# Patient Record
Sex: Female | Born: 1985 | Race: White | Hispanic: No | Marital: Single | Smoking: Never smoker
Health system: Southern US, Community
[De-identification: ages and names within clinical notes are randomized; demographics above are authoritative.]

## PROBLEM LIST (undated history)

## (undated) DIAGNOSIS — K469 Unspecified abdominal hernia without obstruction or gangrene: Secondary | ICD-10-CM

## (undated) DIAGNOSIS — J45909 Unspecified asthma, uncomplicated: Secondary | ICD-10-CM

## (undated) HISTORY — PX: CHOLECYSTECTOMY: SHX55

---

## 2021-04-27 ENCOUNTER — Encounter (HOSPITAL_BASED_OUTPATIENT_CLINIC_OR_DEPARTMENT_OTHER): Payer: Self-pay | Admitting: Emergency Medicine

## 2021-04-27 ENCOUNTER — Other Ambulatory Visit: Payer: Self-pay

## 2021-04-27 ENCOUNTER — Emergency Department (HOSPITAL_BASED_OUTPATIENT_CLINIC_OR_DEPARTMENT_OTHER): Payer: Self-pay

## 2021-04-27 ENCOUNTER — Emergency Department (HOSPITAL_BASED_OUTPATIENT_CLINIC_OR_DEPARTMENT_OTHER)
Admission: EM | Admit: 2021-04-27 | Discharge: 2021-04-27 | Disposition: A | Discharge status: Home / Self Care | Payer: Self-pay | Attending: Emergency Medicine | Admitting: Emergency Medicine

## 2021-04-27 DIAGNOSIS — M25571 Pain in right ankle and joints of right foot: Secondary | ICD-10-CM

## 2021-04-27 DIAGNOSIS — J45909 Unspecified asthma, uncomplicated: Secondary | ICD-10-CM | POA: Insufficient documentation

## 2021-04-27 HISTORY — DX: Unspecified asthma, uncomplicated: J45.909

## 2021-04-27 NOTE — ED Triage Notes (Signed)
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 Pt reports hx of fx x 4 years ago. Pt reports wt bearing increases pain, reports swelling

## 2021-04-27 NOTE — ED Provider Notes (Signed)
[facility] EMERGENCY DEPARTMENT Provider Note   CSN: [id number] Arrival date & time: 04/27/21  3335     History Chief Complaint  Patient presents with   Ankle Pain    Laura Rodriguez is a 35 y.o. female who presents with a cc of R ankle pain. Hx of ORIF 2.5 yrs ago. She is having pain in the ankle for the last 4 days. She has had increased walking and pain. Pain is usually 5/6 now 10/10. Pain is throbbing. Mostly in the lateral malleolar region. Radiates her knee in the distribution of the fibular nerve. She has been unable to bear weight. Patient denies numbness or tingling. She has no recent injuries. Has had ankle boot and PT in the past with improvement in sxs.   Ankle Pain Associated symptoms: no fever       Past Medical History:  Diagnosis Date   Asthma     There are no problems to display for this patient.   Past Surgical History:  Procedure Laterality Date   CESAREAN SECTION       OB History   No obstetric history on file.     History reviewed. No pertinent family history.  Social History   Substance Use Topics   Alcohol use: Never   Drug use: Never    Home Medications Prior to Admission medications   Not on File    Allergies    Amoxicillin, Doxycycline, and Zithromax [azithromycin]  Review of Systems   Review of Systems  Constitutional:  Negative for chills and fever.  Musculoskeletal:  Positive for gait problem and joint swelling.  Neurological:  Negative for weakness and numbness.   Physical Exam Updated Vital Signs BP 114/75   Pulse 84   Temp 98.3 F (36.8 C) (Oral)   Resp 18   Ht 5\' 7"  (1.702 m)   Wt (!) 149.7 kg   SpO2 96%   BMI 51.69 kg/m   Physical Exam Vitals and nursing note reviewed.  Constitutional:      General: She is not in acute distress.    Appearance: She is well-developed. She is not diaphoretic.  HENT:     Head: Normocephalic and atraumatic.     Right Ear: External ear normal.     Left  Ear: External ear normal.     Nose: Nose normal.     Mouth/Throat:     Mouth: Mucous membranes are moist.  Eyes:     General: No scleral icterus.    Conjunctiva/sclera: Conjunctivae normal.  Cardiovascular:     Rate and Rhythm: Normal rate and regular rhythm.     Heart sounds: Normal heart sounds. No murmur heard.   No friction rub. No gallop.  Pulmonary:     Effort: Pulmonary effort is normal. No respiratory distress.     Breath sounds: Normal breath sounds.  Abdominal:     General: Bowel sounds are normal. There is no distension.     Palpations: Abdomen is soft. There is no mass.     Tenderness: There is no abdominal tenderness. There is no guarding.  Musculoskeletal:     Cervical back: Normal range of motion.     Comments: R ankle with ttp along the post aspect of the lateral malleolus. Painful and reduced range of motion\ Minimal swelling noted.  Pain with dorsiflexion, inversion and eversion of the ankle are difficult since reconstruction of the ankle.  Neurovascularly intact.  Skin:    General: Skin is warm and dry.  Neurological:     Mental Status: She is alert and oriented to person, place, and time.  Psychiatric:        Behavior: Behavior normal.    ED Results / Procedures / Treatments   Labs (all labs ordered are listed, but only abnormal results are displayed) Labs Reviewed - No data to display  EKG None  Radiology DG Ankle Complete Right  Result Date: 04/27/2021 CLINICAL DATA:  35 year old female with right lateral ankle pain for 4 days with swelling. EXAM: RIGHT ANKLE - COMPLETE 3+ VIEW COMPARISON:  None. FINDINGS: Bone mineralization is within normal limits. Mortise joint alignment preserved. Talar dome intact. No evidence of joint effusion. Distal tibia and fibula appear intact. Calcaneus appears intact with prominent plantar degenerative spurring. Other visible bones of the right foot appear intact. Evidence of anterior soft tissue swelling. IMPRESSION: No  acute osseous abnormality identified about the right ankle. Electronically Signed   By: Odessa Fleming M.D.   On: 04/27/2021 11:01    Procedures Procedures   Medications Ordered in ED Medications - No data to display  ED Course  I have reviewed the triage vital signs and the nursing notes.  Pertinent labs & imaging results that were available during my care of the patient were reviewed by me and considered in my medical decision making (see chart for details).    MDM Rules/Calculators/A&P                            35 year old female with recurrent right ankle pain.  Appears to have either a tendinitis or a bursitis of the right ankle.  She does well with ankle boot and crutches.  Advised to take naproxen and ice her foot and ankle up to 5 times daily.  Patient given follow-up with podiatry.  Discussed return precautions.  She appears otherwise appropriate for discharge at this time. Final Clinical Impression(s) / ED Diagnoses Final diagnoses:  None    Rx / DC Orders ED Discharge Orders     None        Arthor Captain, PA-C 04/27/21 1152    Terald Sleeper, MD 04/27/21 [phone]

## 2021-04-27 NOTE — Discharge Instructions (Addendum)
Apply ice 20 min at at time at least 5 times a day. Take naproxen or ibuprofen at  daily with food. Contact a health care provider if: Your pain gets worse. Your pain is not relieved with medicines. You have a fever or chills. You are having more trouble with walking. You have new symptoms. Get help right away if: Your foot, leg, toes, or ankle: Tingles or becomes numb. Becomes swollen. Turns pale or blue.

## 2021-06-18 ENCOUNTER — Emergency Department (HOSPITAL_BASED_OUTPATIENT_CLINIC_OR_DEPARTMENT_OTHER): Payer: Self-pay

## 2021-06-18 ENCOUNTER — Other Ambulatory Visit: Payer: Self-pay

## 2021-06-18 ENCOUNTER — Encounter (HOSPITAL_BASED_OUTPATIENT_CLINIC_OR_DEPARTMENT_OTHER): Payer: Self-pay | Admitting: *Deleted

## 2021-06-18 ENCOUNTER — Emergency Department (HOSPITAL_BASED_OUTPATIENT_CLINIC_OR_DEPARTMENT_OTHER)
Admission: EM | Admit: 2021-06-18 | Discharge: 2021-06-18 | Disposition: A | Discharge status: Home / Self Care | Payer: Self-pay | Attending: Emergency Medicine | Admitting: Emergency Medicine

## 2021-06-18 DIAGNOSIS — J4541 Moderate persistent asthma with (acute) exacerbation: Secondary | ICD-10-CM | POA: Insufficient documentation

## 2021-06-18 DIAGNOSIS — Z20822 Contact with and (suspected) exposure to covid-19: Secondary | ICD-10-CM | POA: Insufficient documentation

## 2021-06-18 DIAGNOSIS — R000 Tachycardia, unspecified: Secondary | ICD-10-CM | POA: Insufficient documentation

## 2021-06-18 LAB — CBC WITH DIFFERENTIAL/PLATELET
Abs Immature Granulocytes: 0.15 10*3/uL — ABNORMAL HIGH (ref 0.00–0.07)
Basophils Absolute: 0 10*3/uL (ref 0.0–0.1)
Basophils Relative: 0 %
Eosinophils Absolute: 0.1 10*3/uL (ref 0.0–0.5)
Eosinophils Relative: 1 %
HCT: 42.4 % (ref 36.0–46.0)
Hemoglobin: 14.2 g/dL (ref 12.0–15.0)
Immature Granulocytes: 1 %
Lymphocytes Relative: 23 %
Lymphs Abs: 2.4 10*3/uL (ref 0.7–4.0)
MCH: 29.7 pg (ref 26.0–34.0)
MCHC: 33.5 g/dL (ref 30.0–36.0)
MCV: 88.7 fL (ref 80.0–100.0)
Monocytes Absolute: 0.7 10*3/uL (ref 0.1–1.0)
Monocytes Relative: 7 %
Neutro Abs: 7.1 10*3/uL (ref 1.7–7.7)
Neutrophils Relative %: 68 %
Platelets: 301 10*3/uL (ref 150–400)
RBC: 4.78 MIL/uL (ref 3.87–5.11)
RDW: 12.6 % (ref 11.5–15.5)
WBC: 10.5 10*3/uL (ref 4.0–10.5)
nRBC: 0 % (ref 0.0–0.2)

## 2021-06-18 LAB — BRAIN NATRIURETIC PEPTIDE: B Natriuretic Peptide: 17.8 pg/mL (ref 0.0–100.0)

## 2021-06-18 LAB — BASIC METABOLIC PANEL
Anion gap: 8 (ref 5–15)
BUN: 12 mg/dL (ref 6–20)
CO2: 22 mmol/L (ref 22–32)
Calcium: 9.1 mg/dL (ref 8.9–10.3)
Chloride: 106 mmol/L (ref 98–111)
Creatinine, Ser: 0.88 mg/dL (ref 0.44–1.00)
GFR, Estimated: >60 mL/min (ref >60)
Glucose, Bld: 155 mg/dL — ABNORMAL HIGH (ref 70–99)
Potassium: 3.8 mmol/L (ref 3.5–5.1)
Sodium: 136 mmol/L (ref 135–145)

## 2021-06-18 LAB — TROPONIN I (HIGH SENSITIVITY): Troponin I (High Sensitivity): <2 ng/L (ref <18)

## 2021-06-18 LAB — D-DIMER, QUANTITATIVE: D-Dimer, Quant: 0.5 ug/mL-FEU (ref 0.00–0.50)

## 2021-06-18 LAB — RESP PANEL BY RT-PCR (FLU A&B, COVID) ARPGX2
Influenza A by PCR: NEGATIVE
Influenza B by PCR: NEGATIVE
SARS Coronavirus 2 by RT PCR: NEGATIVE

## 2021-06-18 LAB — PREGNANCY, URINE: Preg Test, Ur: NEGATIVE

## 2021-06-18 MED ORDER — IOHEXOL 350 MG/ML SOLN
100.0000 mL | Freq: Once | INTRAVENOUS | Status: AC | PRN
  Administered 2021-06-18: 100 mL via INTRAVENOUS

## 2021-06-18 MED ORDER — IPRATROPIUM-ALBUTEROL 0.5-2.5 (3) MG/3ML IN SOLN
3.0000 mL | Freq: Once | RESPIRATORY_TRACT | Status: AC
  Administered 2021-06-18: 3 mL via RESPIRATORY_TRACT
  Filled 2021-06-18: qty 3

## 2021-06-18 MED ORDER — PREDNISONE 50 MG PO TABS
60.0000 mg | ORAL_TABLET | Freq: Once | ORAL | Status: AC
  Administered 2021-06-18: 60 mg via ORAL
  Filled 2021-06-18: qty 1

## 2021-06-18 MED ORDER — PREDNISONE 20 MG PO TABS: 40.0000 mg | ORAL_TABLET | Freq: Every day | ORAL | 0 refills | Status: AC

## 2021-06-18 MED ORDER — ALBUTEROL SULFATE HFA 108 (90 BASE) MCG/ACT IN AERS
2.0000 | INHALATION_SPRAY | Freq: Once | RESPIRATORY_TRACT | Status: AC
  Administered 2021-06-18: 2 via RESPIRATORY_TRACT
  Filled 2021-06-18: qty 6.7

## 2021-06-18 NOTE — ED Provider Notes (Signed)
[facility] EMERGENCY DEPARTMENT Provider Note   CSN: [id number] Arrival date & time: 06/18/21  0136     History Chief Complaint  Patient presents with   Shortness of Breath    Laura Rodriguez is a 35 y.o. female.  HPI 35 year old female presents with acute shortness of breath.  She states it started yesterday morning.  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  However now she is having more shortness of breath and some cough with intermittent blood.  Otherwise there is some clear or green sputum.  No fevers.  She feeling chest tightness that is in her front and back.  No fevers.  She has some chronic lower extremity swelling does not particularly worse today.  No abdominal pain.  She feels like the dyspnea is from asthma but her inhaler does not seem to be helping.  The shortness of breath got bad enough that she want to come in tonight.  Past Medical History:  Diagnosis Date   Asthma     There are no problems to display for this patient.   Past Surgical History:  Procedure Laterality Date   CESAREAN SECTION       OB History   No obstetric history on file.     History reviewed. No pertinent family history.  Social History   Substance Use Topics   Alcohol use: Never   Drug use: Never    Home Medications Prior to Admission medications   Medication Sig Start Date End Date Taking? Authorizing Provider  predniSONE (DELTASONE) 20 MG tablet Take 2 tablets (40 mg total) by mouth daily for 4 days. 06/18/21 06/22/21 Yes Pricilla Loveless, MD    Allergies    Amoxicillin, Doxycycline, and Zithromax [azithromycin]  Review of Systems   Review of Systems  Constitutional:  Negative for fever.  Respiratory:  Positive for cough and shortness of breath.   Cardiovascular:  Positive for chest pain.  Gastrointestinal:  Negative for abdominal  pain.  All other systems reviewed and are negative.  Physical Exam Updated Vital Signs BP 103/77   Pulse (!) 103   Temp 98 F (36.7 C) (Oral)   Resp (!) 27   Ht 5\' 7"  (1.702 m)   Wt 136.1 kg   SpO2 98%   BMI 46.99 kg/m   Physical Exam Vitals and nursing note reviewed.  Constitutional:      Appearance: She is well-developed. She is obese. She is not ill-appearing or diaphoretic.  HENT:     Head: Normocephalic and atraumatic.     Right Ear: External ear normal.     Left Ear: External ear normal.     Nose: Nose normal.  Eyes:     General:        Right eye: No discharge.        Left eye: No discharge.  Cardiovascular:     Rate and Rhythm: Regular rhythm. Tachycardia present.     Heart sounds: Normal heart sounds.     Comments: HR~100 Pulmonary:     Effort: Pulmonary effort is normal.     Breath sounds: Normal breath sounds.     Comments: There is some inspiratory wheezing when taking deep breaths Abdominal:     Palpations: Abdomen is soft.     Tenderness: There is no abdominal tenderness.  Musculoskeletal:     Right lower leg: No  edema.     Left lower leg: No edema.  Skin:    General: Skin is warm and dry.  Neurological:     Mental Status: She is alert.  Psychiatric:        Mood and Affect: Mood is not anxious.    ED Results / Procedures / Treatments   Labs (all labs ordered are listed, but only abnormal results are displayed) Labs Reviewed  BASIC METABOLIC PANEL - Abnormal; Notable for the following components:      Result Value   Glucose, Bld 155 (*)    All other components within normal limits  CBC WITH DIFFERENTIAL/PLATELET - Abnormal; Notable for the following components:   Abs Immature Granulocytes 0.15 (*)    All other components within normal limits  RESP PANEL BY RT-PCR (FLU A&B, COVID) ARPGX2  PREGNANCY, URINE  D-DIMER, QUANTITATIVE  BRAIN NATRIURETIC PEPTIDE  TROPONIN I (HIGH SENSITIVITY)    EKG EKG Interpretation  Date/Time:  Sunday  June 18 2021 02:14:54 EDT Ventricular Rate:  97 PR Interval:  143 QRS Duration: 100 QT Interval:  351 QTC Calculation: 446 R Axis:   60 Text Interpretation: Sinus rhythm Borderline T abnormalities, diffuse leads No old tracing to compare Confirmed by Pricilla Loveless [phone]) on 06/18/2021 2:33:23 AM  Radiology CT Angio Chest PE W and/or Wo Contrast  Result Date: 06/18/2021 CLINICAL DATA:  Worsening shortness of breath EXAM: CT ANGIOGRAPHY CHEST WITH CONTRAST TECHNIQUE: Multidetector CT imaging of the chest was performed using the standard protocol during bolus administration of intravenous contrast. Multiplanar CT image reconstructions and MIPs were obtained to evaluate the vascular anatomy. CONTRAST:  OMNIPAQUE IOHEXOL 350 MG/ML SOLN COMPARISON:  None. FINDINGS: Cardiovascular: Suboptimal opacification of the pulmonary arteries to the segmental level. Both the patient's body habitus and bolus dispersion limit vessel opacification. No evidence of pulmonary embolism. Normal heart size. No pericardial effusion. Mediastinum/Nodes: Negative for mass or adenopathy Lungs/Pleura: There is no edema, consolidation, effusion, or pneumothorax. Two nodules along the minor fissure are flat on coronal reformats and consistent with lymph nodes. Upper Abdomen: Negative Musculoskeletal: No acute or aggressive finding Review of the MIP images confirms the above findings. Other: Notable motion artifact at the lung bases. IMPRESSION: Suboptimal opacification of the pulmonary arteries. No evidence of pulmonary embolism or other acute process. Electronically Signed   By: Tiburcio Pea M.D.   On: 06/18/2021 04:09   DG Chest Portable 1 View  Result Date: 06/18/2021 CLINICAL DATA:  Cough and dyspnea EXAM: PORTABLE CHEST 1 VIEW COMPARISON:  None. FINDINGS: The heart size and mediastinal contours are within normal limits. Both lungs are clear. The visualized skeletal structures are unremarkable. IMPRESSION: No active  disease. Electronically Signed   By: Alcide Clever M.D.   On: 06/18/2021 02:28    Procedures Procedures   Medications Ordered in ED Medications  ipratropium-albuterol (DUONEB) 0.5-2.5 (3) MG/3ML nebulizer solution 3 mL (3 mLs Nebulization Given 06/18/21 0219)  iohexol (OMNIPAQUE) 350 MG/ML injection 100 mL (100 mLs Intravenous Contrast Given 06/18/21 0342)  predniSONE (DELTASONE) tablet 60 mg (60 mg Oral Given 06/18/21 0429)  albuterol (VENTOLIN HFA) 108 (90 Base) MCG/ACT inhaler 2 puff (2 puffs Inhalation Given 06/18/21 0442)    ED Course  I have reviewed the triage vital signs and the nursing notes.  Pertinent labs & imaging results that were available during my care of the patient were reviewed by me and considered in my medical decision making (see chart for details).    MDM Rules/Calculators/A&P  Most likely, patient is suffering from a viral illness and/or asthma exacerbation.  She is feeling somewhat better after some albuterol.  Still complains of some chest pain but with 24 hours of symptoms and negative troponin and benign ECG I think ACS is unlikely.  D-dimer is just at the cutoff so CTA was obtained.  It does not show an obvious PE though it is limited based on size and bolus timing.  However given lower concern for PE I think it is reasonable to treat as asthma and give return precautions.  She feels well enough for discharge at this time. No evidence of pneumonia. Final Clinical Impression(s) / ED Diagnoses Final diagnoses:  Moderate persistent asthma with exacerbation    Rx / DC Orders ED Discharge Orders          Ordered    predniSONE (DELTASONE) 20 MG tablet  Daily        06/18/21 0422             Pricilla Loveless, MD 06/18/21 [phone]

## 2021-06-18 NOTE — ED Notes (Signed)
Pt given ice pack for her back

## 2021-06-18 NOTE — ED Notes (Signed)
Pt walks to restroom with steady gait and assistance

## 2021-06-18 NOTE — ED Triage Notes (Signed)
Pt c/o sob that started this am and has gotten progressively worse; pt states tonight while at work she started vomiting bright red blood; pt states she used her inhaler with no relief

## 2021-06-18 NOTE — Discharge Instructions (Signed)
Use your albuterol inhaler 2 puffs every 4 hours as needed for cough or shortness of breath.  If you develop new or worsening chest pain, shortness of breath, coughing up blood, fever, or any other new/concerning symptoms then return to the ER for evaluation.

## 2021-06-18 NOTE — ED Notes (Signed)
Pt sitting fowlers in bed, upper airway noises heard. A/ox4, pt states she has asthma and has had an exacerbation since Saturday morning with no relief from inhaler. Speaking in full sentences. LS shallow and clear, airway noises heard from throat. RA SPO2 99%. Pt also c/o chest tightness from central chest to back which is different from her asthma

## 2021-08-05 ENCOUNTER — Encounter (HOSPITAL_BASED_OUTPATIENT_CLINIC_OR_DEPARTMENT_OTHER): Payer: Self-pay | Admitting: Urology

## 2021-08-05 ENCOUNTER — Other Ambulatory Visit: Payer: Self-pay

## 2021-08-05 ENCOUNTER — Emergency Department (HOSPITAL_BASED_OUTPATIENT_CLINIC_OR_DEPARTMENT_OTHER)
Admission: EM | Admit: 2021-08-05 | Discharge: 2021-08-06 | Disposition: A | Discharge status: Home / Self Care | Payer: Medicaid Other | Attending: Emergency Medicine | Admitting: Emergency Medicine

## 2021-08-05 DIAGNOSIS — Z20822 Contact with and (suspected) exposure to covid-19: Secondary | ICD-10-CM | POA: Insufficient documentation

## 2021-08-05 DIAGNOSIS — J45909 Unspecified asthma, uncomplicated: Secondary | ICD-10-CM | POA: Insufficient documentation

## 2021-08-05 DIAGNOSIS — R110 Nausea: Secondary | ICD-10-CM | POA: Insufficient documentation

## 2021-08-05 DIAGNOSIS — R197 Diarrhea, unspecified: Secondary | ICD-10-CM | POA: Insufficient documentation

## 2021-08-05 DIAGNOSIS — Z91040 Latex allergy status: Secondary | ICD-10-CM | POA: Insufficient documentation

## 2021-08-05 DIAGNOSIS — R1084 Generalized abdominal pain: Secondary | ICD-10-CM

## 2021-08-05 HISTORY — DX: Unspecified abdominal hernia without obstruction or gangrene: K46.9

## 2021-08-05 LAB — COMPREHENSIVE METABOLIC PANEL
ALT: 56 U/L — ABNORMAL HIGH (ref 0–44)
AST: 39 U/L (ref 15–41)
Albumin: 4 g/dL (ref 3.5–5.0)
Alkaline Phosphatase: 112 U/L (ref 38–126)
Anion gap: 8 (ref 5–15)
BUN: 12 mg/dL (ref 6–20)
CO2: 23 mmol/L (ref 22–32)
Calcium: 9.2 mg/dL (ref 8.9–10.3)
Chloride: 107 mmol/L (ref 98–111)
Creatinine, Ser: 0.69 mg/dL (ref 0.44–1.00)
GFR, Estimated: >60 mL/min (ref >60)
Glucose, Bld: 163 mg/dL — ABNORMAL HIGH (ref 70–99)
Potassium: 3.7 mmol/L (ref 3.5–5.1)
Sodium: 138 mmol/L (ref 135–145)
Total Bilirubin: 0.5 mg/dL (ref 0.3–1.2)
Total Protein: 7 g/dL (ref 6.5–8.1)

## 2021-08-05 LAB — CBC WITH DIFFERENTIAL/PLATELET
Abs Immature Granulocytes: 0.09 10*3/uL — ABNORMAL HIGH (ref 0.00–0.07)
Basophils Absolute: 0 10*3/uL (ref 0.0–0.1)
Basophils Relative: 0 %
Eosinophils Absolute: 0.1 10*3/uL (ref 0.0–0.5)
Eosinophils Relative: 1 %
HCT: 40.1 % (ref 36.0–46.0)
Hemoglobin: 13.2 g/dL (ref 12.0–15.0)
Immature Granulocytes: 1 %
Lymphocytes Relative: 23 %
Lymphs Abs: 2.2 10*3/uL (ref 0.7–4.0)
MCH: 29.7 pg (ref 26.0–34.0)
MCHC: 32.9 g/dL (ref 30.0–36.0)
MCV: 90.1 fL (ref 80.0–100.0)
Monocytes Absolute: 0.7 10*3/uL (ref 0.1–1.0)
Monocytes Relative: 7 %
Neutro Abs: 6.4 10*3/uL (ref 1.7–7.7)
Neutrophils Relative %: 68 %
Platelets: 291 10*3/uL (ref 150–400)
RBC: 4.45 MIL/uL (ref 3.87–5.11)
RDW: 12.6 % (ref 11.5–15.5)
WBC: 9.6 10*3/uL (ref 4.0–10.5)
nRBC: 0 % (ref 0.0–0.2)

## 2021-08-05 LAB — LIPASE, BLOOD: Lipase: 35 U/L (ref 11–51)

## 2021-08-05 MED ORDER — DICYCLOMINE HCL 10 MG PO CAPS
10.0000 mg | ORAL_CAPSULE | Freq: Once | ORAL | Status: AC
  Administered 2021-08-05: 10 mg via ORAL
  Filled 2021-08-05: qty 1

## 2021-08-05 MED ORDER — ONDANSETRON 4 MG PO TBDP
4.0000 mg | ORAL_TABLET | Freq: Once | ORAL | Status: AC
  Administered 2021-08-05: 4 mg via ORAL
  Filled 2021-08-05: qty 1

## 2021-08-05 NOTE — ED Provider Notes (Signed)
Emergency Department Provider Note   I have reviewed the triage vital signs and the nursing notes.   HISTORY  Chief Complaint Abdominal Pain   HPI Laura Rodriguez is a 35 y.o. female with PMH of asthma and abdominal hernia presents to the ED with nausea along with vomiting.  Patient has had some associated abdominal discomfort as well as burning pain in the abdomen.  She states this is typical of her "bacterial" infections and typically resolves with bentyl.  She has recently moved here from [place] and does not have a PCP or GI physician.  She describes taking Bentyl in the past but ran out 2 months ago.  Her current symptoms began in the past week.  She is able to keep food and fluids down but has diarrhea shortly afterwards.  She denies any pain into the chest.  Abdominal pain is epigastric and burning in quality.  She has seen some trace bright red blood in the stool but no rectal pain or black stool.   Past Medical History:  Diagnosis Date   Asthma    Hernia of abdominal cavity     There are no problems to display for this patient.   Past Surgical History:  Procedure Laterality Date   CESAREAN SECTION     CHOLECYSTECTOMY      Allergies Amoxicillin, Doxycycline, Latex, and Zithromax [azithromycin]  History reviewed. No pertinent family history.  Social History Social History   Substance Use Topics   Alcohol use: Never   Drug use: Never    Review of Systems  Constitutional: No fever/chills Eyes: No visual changes. ENT: No sore throat. Cardiovascular: Denies chest pain. Respiratory: Denies shortness of breath. Gastrointestinal: Positive abdominal pain.  Positive nausea, no vomiting. Positive diarrhea.  No constipation. Genitourinary: Negative for dysuria. Musculoskeletal: Negative for back pain. Skin: Negative for rash. Neurological: Negative for headaches, focal weakness or numbness.  10-point ROS otherwise  negative.  ____________________________________________   PHYSICAL EXAM:  VITAL SIGNS: ED Triage Vitals  Enc Vitals Group     BP 08/05/21 2247 (!) 151/109     Pulse Rate 08/05/21 2247 (!) 110     Resp 08/05/21 2247 18     Temp 08/05/21 2247 99.2 F (37.3 C)     Temp Source 08/05/21 2247 Oral     SpO2 08/05/21 2247 98 %     Weight 08/05/21 2245 (!) 300 lb 0.7 oz (136.1 kg)     Height 08/05/21 2245 5\' 7"  (1.702 m)   Constitutional: Alert and oriented. Well appearing and in no acute distress. Eyes: Conjunctivae are normal. Head: Atraumatic. Nose: No congestion/rhinnorhea. Mouth/Throat: Mucous membranes are moist.   Neck: No stridor.  Cardiovascular: Normal rate, regular rhythm. Good peripheral circulation. Grossly normal heart sounds.   Respiratory: Normal respiratory effort.  No retractions. Lungs CTAB. Gastrointestinal: Soft with palpable large hernia just superior to the umbilicus. No peritonitis. No overlying skin changes. No distention.  Musculoskeletal: No lower extremity tenderness nor edema. No gross deformities of extremities. Neurologic:  Normal speech and language. No gross focal neurologic deficits are appreciated.  Skin:  Skin is warm, dry and intact. No rash noted.  ____________________________________________   LABS (all labs ordered are listed, but only abnormal results are displayed)  Labs Reviewed  CBC WITH DIFFERENTIAL/PLATELET - Abnormal; Notable for the following components:      Result Value   Abs Immature Granulocytes 0.09 (*)    All other components within normal limits  COMPREHENSIVE METABOLIC PANEL - Abnormal;  Notable for the following components:   Glucose, Bld 163 (*)    ALT 56 (*)    All other components within normal limits  RESP PANEL BY RT-PCR (FLU A&B, COVID) ARPGX2  LIPASE, BLOOD  URINALYSIS, ROUTINE W REFLEX MICROSCOPIC  PREGNANCY, URINE   ____________________________________________  RADIOLOGY  None    ____________________________________________   PROCEDURES  Procedure(s) performed:   Procedures  None  ____________________________________________   INITIAL IMPRESSION / ASSESSMENT AND PLAN / ED COURSE  Pertinent labs & imaging results that were available during my care of the patient were reviewed by me and considered in my medical decision making (see chart for details).   Patient presents emergency department with abdominal pain along with nausea and diarrhea.  Abdomen is diffusely soft and nontender.  She does have a large palpable hernia which is baseline for her.  This does not appear incarcerated or strangulated.  Patient is very well-appearing.  She is up and walking about the room without difficulty.  She describes similar episodes that respond to Bentyl and Zofran.  Her vital signs on arrival show heart rate of 110 but on my evaluation in the room her vital signs are all within normal limits.  She is afebrile here.  Given her exam and history of similar episodes I do plan to hold on CT imaging for now and follow lab work along with bentyl and zofran given here.   Labs are reassuring. No AKI. No leukocytosis. Normal LFTs and bilirubin. Patient is feeling better after bentyl and zofran with normalized vital signs. UA and pregnancy pending.   UA and U preg WNL. Plan for bentyl, zofran, lomotil and conact information for PCP. Patient is tolerating PO without difficulty and ambulatory in the department. Appears well-hydrated here.  ____________________________________________  FINAL CLINICAL IMPRESSION(S) / ED DIAGNOSES  Final diagnoses:  Generalized abdominal pain  Nausea  Diarrhea, unspecified type     MEDICATIONS GIVEN DURING THIS VISIT:  Medications  dicyclomine (BENTYL) capsule 10 mg (10 mg Oral Given 08/05/21 2313)  ondansetron (ZOFRAN-ODT) disintegrating tablet 4 mg (4 mg Oral Given 08/05/21 2313)     NEW OUTPATIENT MEDICATIONS STARTED DURING THIS  VISIT:  New Prescriptions   DICYCLOMINE (BENTYL) 20 MG TABLET    Take 1 tablet (20 mg total) by mouth 3 (three) times daily as needed for spasms (abdominal cramping).   DIPHENOXYLATE-ATROPINE (LOMOTIL) 2.5-0.025 MG TABLET    Take 1 tablet by mouth 4 (four) times daily as needed for diarrhea or loose stools.   ONDANSETRON (ZOFRAN ODT) 4 MG DISINTEGRATING TABLET    Take 1 tablet (4 mg total) by mouth every 8 (eight) hours as needed.    Note:  This document was prepared using Dragon voice recognition software and may include unintentional dictation errors.  Alona Bene, MD, [facility]    Annabelle Rexroad, Arlyss Repress, MD 08/06/21 [phone]

## 2021-08-05 NOTE — ED Triage Notes (Signed)
Nausea/vomiting, diarrhea, abdominal pain "burning pain".  States bright red blood in stool.  H/o hernia.  States "pressure cramps".  H/o same symptoms takes bentyl for symptoms but is out of medication

## 2021-08-05 NOTE — ED Notes (Signed)
Pt was given ginger ale with ice.

## 2021-08-06 LAB — RESP PANEL BY RT-PCR (FLU A&B, COVID) ARPGX2
Influenza A by PCR: NEGATIVE
Influenza B by PCR: NEGATIVE
SARS Coronavirus 2 by RT PCR: NEGATIVE

## 2021-08-06 LAB — URINALYSIS, ROUTINE W REFLEX MICROSCOPIC
Bilirubin Urine: NEGATIVE
Glucose, UA: NEGATIVE mg/dL
Hgb urine dipstick: NEGATIVE
Ketones, ur: NEGATIVE mg/dL
Leukocytes,Ua: NEGATIVE
Nitrite: NEGATIVE
Protein, ur: NEGATIVE mg/dL
Specific Gravity, Urine: >=1.03 (ref 1.005–1.030)
pH: 5.5 (ref 5.0–8.0)

## 2021-08-06 LAB — PREGNANCY, URINE: Preg Test, Ur: NEGATIVE

## 2021-08-06 MED ORDER — DIPHENOXYLATE-ATROPINE 2.5-0.025 MG PO TABS: 1.0000 | ORAL_TABLET | Freq: Four times a day (QID) | ORAL | 0 refills | Status: AC | PRN

## 2021-08-06 MED ORDER — DICYCLOMINE HCL 20 MG PO TABS: 20.0000 mg | ORAL_TABLET | Freq: Three times a day (TID) | ORAL | 0 refills | Status: AC | PRN

## 2021-08-06 MED ORDER — ONDANSETRON 4 MG PO TBDP: 4.0000 mg | ORAL_TABLET | Freq: Three times a day (TID) | ORAL | 0 refills | Status: AC | PRN

## 2021-08-06 NOTE — ED Notes (Signed)
Oral hydration provided. Pt sts she will attempt to urinate after.

## 2021-08-06 NOTE — Discharge Instructions (Signed)

## 2021-11-28 ENCOUNTER — Emergency Department (HOSPITAL_BASED_OUTPATIENT_CLINIC_OR_DEPARTMENT_OTHER)
Admission: EM | Admit: 2021-11-28 | Discharge: 2021-11-28 | Disposition: A | Discharge status: Home / Self Care | Payer: Medicaid Other | Attending: Emergency Medicine | Admitting: Emergency Medicine

## 2021-11-28 ENCOUNTER — Emergency Department (HOSPITAL_BASED_OUTPATIENT_CLINIC_OR_DEPARTMENT_OTHER): Payer: Medicaid Other

## 2021-11-28 ENCOUNTER — Encounter (HOSPITAL_BASED_OUTPATIENT_CLINIC_OR_DEPARTMENT_OTHER): Payer: Self-pay | Admitting: Urology

## 2021-11-28 ENCOUNTER — Other Ambulatory Visit (HOSPITAL_BASED_OUTPATIENT_CLINIC_OR_DEPARTMENT_OTHER): Payer: Self-pay

## 2021-11-28 ENCOUNTER — Other Ambulatory Visit: Payer: Self-pay

## 2021-11-28 DIAGNOSIS — W208XXA Other cause of strike by thrown, projected or falling object, initial encounter: Secondary | ICD-10-CM | POA: Insufficient documentation

## 2021-11-28 DIAGNOSIS — S97112A Crushing injury of left great toe, initial encounter: Secondary | ICD-10-CM | POA: Insufficient documentation

## 2021-11-28 DIAGNOSIS — S97111A Crushing injury of right great toe, initial encounter: Secondary | ICD-10-CM | POA: Insufficient documentation

## 2021-11-28 DIAGNOSIS — S97119A Crushing injury of unspecified great toe, initial encounter: Secondary | ICD-10-CM

## 2021-11-28 MED ORDER — CEPHALEXIN 500 MG PO CAPS
500.0000 mg | ORAL_CAPSULE | Freq: Four times a day (QID) | ORAL | 0 refills | Status: AC
  Filled 2021-11-28: qty 28, 7d supply, fill #0

## 2021-11-28 MED ORDER — HYDROCODONE-ACETAMINOPHEN 5-325 MG PO TABS
1.0000 | ORAL_TABLET | Freq: Four times a day (QID) | ORAL | 0 refills | Status: DC | PRN
  Filled 2021-11-28: qty 14, 4d supply, fill #0

## 2021-11-28 NOTE — ED Provider Notes (Signed)
?[facility] EMERGENCY DEPARTMENT ?Provider Note ? ? ?CSN: [id number] ?Arrival date & time: 11/28/21  1458 ? ?  ? ?History ? ?Chief Complaint  ?Patient presents with  ? Toe Injury  ? ? ?Laura Rodriguez is a 36 y.o. female. ? ?Patient with a complaint of bilateral big toe pain.  And pain to the top of her foot bilateral.  States she dropped a couch on it.  States also there was an ingrown toenail on the left toenail.  And had some pus come out.  Patient has a listing of a lot of allergies to include doxycycline also amoxicillin.  But says she has had Keflex before cephalosporins without any problem.  Injury occurred on Friday ? ?Patient's past medical history is significant for asthma. ? ? ?  ? ?Home Medications ?Prior to Admission medications   ?Medication Sig Start Date End Date Taking? Authorizing Provider  ?cephALEXin (KEFLEX) 500 MG capsule Take 1 capsule (500 mg total) by mouth 4 (four) times daily. 11/28/21  Yes Fredia Sorrow, MD  ?dicyclomine (BENTYL) 20 MG tablet Take 1 tablet (20 mg total) by mouth 3 (three) times daily as needed for spasms (abdominal cramping). 08/06/21   Long, Wonda Olds, MD  ?diphenoxylate-atropine (LOMOTIL) 2.5-0.025 MG tablet Take 1 tablet by mouth 4 (four) times daily as needed for diarrhea or loose stools. 08/06/21   Long, Wonda Olds, MD  ?ondansetron (ZOFRAN ODT) 4 MG disintegrating tablet Take 1 tablet (4 mg total) by mouth every 8 (eight) hours as needed. 08/06/21   Long, Wonda Olds, MD  ?   ? ?Allergies    ?Amoxicillin, Doxycycline, Latex, and Zithromax [azithromycin]   ? ?Review of Systems   ?Review of Systems  ?Constitutional:  Negative for chills and fever.  ?HENT:  Negative for ear pain and sore throat.   ?Eyes:  Negative for pain and visual disturbance.  ?Respiratory:  Negative for cough and shortness of breath.   ?Cardiovascular:  Negative for chest pain and palpitations.  ?Gastrointestinal:  Negative for abdominal pain and vomiting.  ?Genitourinary:  Negative for  dysuria and hematuria.  ?Musculoskeletal:  Negative for arthralgias and back pain.  ?Skin:  Negative for color change and rash.  ?Neurological:  Negative for seizures and syncope.  ?All other systems reviewed and are negative. ? ?Physical Exam ?Updated Vital Signs ?BP 126/68 (BP Location: Right Arm)   Pulse 87   Temp 98.2 ?F (36.8 ?C) (Oral)   Resp 17   Ht 1.702 m (5\' 7" )   Wt (!) 136.1 kg   SpO2 100%   BMI 46.99 kg/m?  ?Physical Exam ?Vitals and nursing note reviewed.  ?Constitutional:   ?   General: She is not in acute distress. ?   Appearance: Normal appearance. She is well-developed.  ?HENT:  ?   Head: Normocephalic and atraumatic.  ?Eyes:  ?   Extraocular Movements: Extraocular movements intact.  ?   Conjunctiva/sclera: Conjunctivae normal.  ?Cardiovascular:  ?   Rate and Rhythm: Normal rate and regular rhythm.  ?   Heart sounds: No murmur heard. ?Pulmonary:  ?   Effort: Pulmonary effort is normal. No respiratory distress.  ?   Breath sounds: Normal breath sounds.  ?Abdominal:  ?   Palpations: Abdomen is soft.  ?   Tenderness: There is no abdominal tenderness.  ?Musculoskeletal:     ?   General: Swelling and tenderness present.  ?   Cervical back: Neck supple.  ?   Comments: Both big toes have a  little bit of bruising to the lateral aspect of the big toenails.  The left toenail has no significant erythema in the paronychia.  But there is some whiteness to the lateral aspect of the nail.  Patient to trim the nail back there.  There could have been evidence of an ingrown toenail there.  No pus draining currently.  Good cap refill to all the toes.  There is tenderness to palpation to the top of both toes and the top of the left and right foot.  But no signs of any bruising there.  Dorsalis pedis pulses 2+.  Good cap refill sensation intact.  ?Skin: ?   General: Skin is warm and dry.  ?   Capillary Refill: Capillary refill takes less than 2 seconds.  ?Neurological:  ?   General: No focal deficit present.  ?    Mental Status: She is alert and oriented to person, place, and time.  ?Psychiatric:     ?   Mood and Affect: Mood normal.  ? ? ?ED Results / Procedures / Treatments   ?Labs ?(all labs ordered are listed, but only abnormal results are displayed) ?Labs Reviewed - No data to display ? ?EKG ?None ? ?Radiology ?DG Toe Great Left ? ?Result Date: 11/28/2021 ?CLINICAL DATA:  Trauma yesterday.  Pain and swelling. EXAM: LEFT GREAT TOE COMPARISON:  None. FINDINGS: No acute fracture or dislocation. IMPRESSION: No acute osseous abnormality. Electronically Signed   By: Abigail Miyamoto M.D.   On: 11/28/2021 15:45  ? ?DG Toe Great Right ? ?Result Date: 11/28/2021 ?CLINICAL DATA:  Dropped couch on toes yesterday EXAM: RIGHT GREAT TOE COMPARISON:  None. FINDINGS: There is no evidence of fracture or dislocation. There is no evidence of arthropathy or other focal bone abnormality. Soft tissues are unremarkable. IMPRESSION: Negative. Electronically Signed   By: Jacqulynn Cadet M.D.   On: 11/28/2021 15:47   ? ?Procedures ?Procedures  ? ? ?Medications Ordered in ED ?Medications - No data to display ? ?ED Course/ Medical Decision Making/ A&P ?  ?                        ?Medical Decision Making ?Amount and/or Complexity of Data Reviewed ?Radiology: ordered. ? ?Risk ?Prescription drug management. ? ? ?Patient has evidence of some trauma to both great toes.  X-rays negative for any fractures.  X-rays are just of the big toes.  No significant swelling or bruising to the forefoot area.  Do not feel x-rays are necessary there.  There is some evidence of a small subungual hematoma.  This injury would have occurred on Friday.  Specifically no indication for drainage of the subungual area. ? ?Patient will be treated with Keflex.  She is able to take cephalosporins even though she has significant allergy to penicillin.  Patient states she has had that before without any problems.  Patient not able to take hydrocodone at all she gets significant hives  and not aware of being able to take any other pain medications.  So recommended nonsteroidal but she cannot take that because she has Crohn's.  So we will get is got a go with Tylenol and the Keflex and soaking her feet bilaterally and warm water for 20 minutes twice a day.  And then follow-up with podiatry.  Work note provided to be out of work through Friday. ? ? ?Final Clinical Impression(s) / ED Diagnoses ?Final diagnoses:  ?Crushing injury of great toe, initial encounter  ? ? ?  Rx / DC Orders ?ED Discharge Orders   ? ?      Ordered  ?  cephALEXin (KEFLEX) 500 MG capsule  4 times daily       ? 11/28/21 1701  ?  HYDROcodone-acetaminophen (NORCO/VICODIN) 5-325 MG tablet  Every 6 hours PRN,   Status:  Discontinued       ? 11/28/21 1705  ? ?  ?  ? ?  ? ? ?  ?Fredia Sorrow, MD ?11/28/21 1756 ? ?

## 2021-11-28 NOTE — Discharge Instructions (Addendum)
Take the amoxicillin as directed.  Make an appointment follow-up with podiatry.  Work note provided.  Soak your feet in warm water for 20 minutes twice a day.  X-rays of feet without any bony abnormalities. ? ?Due to the allergy to hydrocodone have canceled the prescription for that with the pharmacy here.  Just take Tylenol as needed. ?

## 2021-11-28 NOTE — ED Triage Notes (Signed)
Pt states dropped couch on bilateral big toes yesterday ?States painful to walk  ?Bruising noted  ?Mild swelling to left great toe  ?

## 2022-02-26 ENCOUNTER — Emergency Department (HOSPITAL_BASED_OUTPATIENT_CLINIC_OR_DEPARTMENT_OTHER)
Admission: EM | Admit: 2022-02-26 | Discharge: 2022-02-26 | Disposition: A | Discharge status: Home / Self Care | Payer: Medicaid Other | Attending: Emergency Medicine | Admitting: Emergency Medicine

## 2022-02-26 ENCOUNTER — Emergency Department (HOSPITAL_BASED_OUTPATIENT_CLINIC_OR_DEPARTMENT_OTHER): Payer: Medicaid Other

## 2022-02-26 ENCOUNTER — Other Ambulatory Visit: Payer: Self-pay

## 2022-02-26 DIAGNOSIS — M25511 Pain in right shoulder: Secondary | ICD-10-CM | POA: Insufficient documentation

## 2022-02-26 DIAGNOSIS — Z91040 Latex allergy status: Secondary | ICD-10-CM | POA: Insufficient documentation

## 2022-02-26 MED ORDER — KETOROLAC TROMETHAMINE 15 MG/ML IJ SOLN
15.0000 mg | Freq: Once | INTRAMUSCULAR | Status: AC
  Administered 2022-02-26: 15 mg via INTRAMUSCULAR
  Filled 2022-02-26: qty 1

## 2022-02-26 NOTE — Discharge Instructions (Signed)
Take 4 over the counter ibuprofen tablets 3 times a day or 2 over-the-counter naproxen tablets twice a day for pain. Also take tylenol 1000mg (2 extra strength) four times a day.    You are to take your arm out of the sling at least 4 times a day and perform range of motion exercises.  Please call the sports medicine doctor in the morning and see if they can see you in the office.

## 2022-02-26 NOTE — ED Triage Notes (Addendum)
Pt POV reports ongoing shoulder pain x2 months, today worsened at work. Reports poor grip strength in right hand, reports tingling in arm and fingers. Reports feeling something tear in bicep.

## 2022-02-26 NOTE — ED Provider Notes (Signed)
[facility] EMERGENCY DEPARTMENT Provider Note   CSN: [id number] Arrival date & time: 02/26/22  1623     History  Chief Complaint  Patient presents with   Shoulder Pain    Laura Rodriguez is a 36 y.o. female.  36 yo F with a chief complaint of right shoulder pain.  This been going on for weeks now.  She said that she moved a very heavy piece of furniture and after that felt like something had popped or torn.  She had recurrence of this today when she was trying to move something around.  She is convinced that she is torn her bicep and feels like she can feel something that is abnormal.  She feels it in her axilla.  She denies any obvious traumatic event.  She feels like the arm is more swollen on that side and feels like she is having trouble holding things with that arm.   Shoulder Pain      Home Medications Prior to Admission medications   Medication Sig Start Date End Date Taking? Authorizing Provider  cephALEXin (KEFLEX) 500 MG capsule Take 1 capsule (500 mg total) by mouth 4 (four) times daily. 11/28/21   Vanetta Mulders, MD  dicyclomine (BENTYL) 20 MG tablet Take 1 tablet (20 mg total) by mouth 3 (three) times daily as needed for spasms (abdominal cramping). 08/06/21   Long, Arlyss Repress, MD  diphenoxylate-atropine (LOMOTIL) 2.5-0.025 MG tablet Take 1 tablet by mouth 4 (four) times daily as needed for diarrhea or loose stools. 08/06/21   Long, Arlyss Repress, MD  ondansetron (ZOFRAN ODT) 4 MG disintegrating tablet Take 1 tablet (4 mg total) by mouth every 8 (eight) hours as needed. 08/06/21   Long, Arlyss Repress, MD      Allergies    Amoxicillin, Doxycycline, Latex, and Zithromax [azithromycin]    Review of Systems   Review of Systems  Physical Exam Updated Vital Signs BP (!) 124/97 (BP Location: Left Wrist)   Pulse (!) 107   Temp 99.4 F (37.4 C) (Oral)   Resp 19   Ht 5\' 7"  (1.702 m)   Wt (!) 149.7 kg   SpO2 98%   BMI 51.69 kg/m  Physical Exam Vitals and  nursing note reviewed.  Constitutional:      General: She is not in acute distress.    Appearance: She is well-developed. She is not diaphoretic.     Comments: BMI 51  HENT:     Head: Normocephalic and atraumatic.  Eyes:     Pupils: Pupils are equal, round, and reactive to light.  Cardiovascular:     Rate and Rhythm: Normal rate and regular rhythm.     Heart sounds: No murmur heard.    No friction rub. No gallop.  Pulmonary:     Effort: Pulmonary effort is normal.     Breath sounds: No wheezing or rales.  Abdominal:     General: There is no distension.     Palpations: Abdomen is soft.     Tenderness: There is no abdominal tenderness.  Musculoskeletal:        General: Tenderness present.     Cervical back: Normal range of motion and neck supple.     Comments: Pulse motor and sensation intact of the right upper extremity.  She has some mild pain about the clavicle without obvious crepitus or deformity.  Exam is somewhat limited due to body habitus.  Also limited to was described as severe discomfort.  Skin:  General: Skin is warm and dry.  Neurological:     Mental Status: She is alert and oriented to person, place, and time.  Psychiatric:        Behavior: Behavior normal.     ED Results / Procedures / Treatments   Labs (all labs ordered are listed, but only abnormal results are displayed) Labs Reviewed - No data to display  EKG None  Radiology DG Shoulder Right  Result Date: 02/26/2022 CLINICAL DATA:  Right shoulder pain EXAM: RIGHT SHOULDER - 2+ VIEW COMPARISON:  None Available. FINDINGS: There is no evidence of fracture or dislocation. There is no evidence of arthropathy or other focal bone abnormality. Soft tissues are unremarkable. IMPRESSION: Negative. Electronically Signed   By: Frazier Richards M.D.   On: 02/26/2022 17:37    Procedures Procedures    Medications Ordered in ED Medications  ketorolac (TORADOL) 15 MG/ML injection 15 mg (has no administration in  time range)    ED Course/ Medical Decision Making/ A&P                           Medical Decision Making Amount and/or Complexity of Data Reviewed Radiology: ordered.  Risk Prescription drug management.   36 yo F with a chief complaint of right shoulder pain.  This been going on for at least a month.  She to me that she moved something heavy and after which had some discomfort there.  She is pretty convinced that she tore something and is asking what it is that she tore specifically.  I discussed the work-up that would be typically performed here in the emergency department and recommended outpatient follow-up.  She again asked specifically what was torn and then later added that she has had problems with that shoulder in the past.  Has had her rotator cuff torn.  Will place in a sling for comfort.  Plain film of the shoulder independently interpreted by me without fx or dislocation.   5:44 PM:  I have discussed the diagnosis/risks/treatment options with the patient.  Evaluation and diagnostic testing in the emergency department does not suggest an emergent condition requiring admission or immediate intervention beyond what has been performed at this time.  They will follow up with  PCP. We also discussed returning to the ED immediately if new or worsening sx occur. We discussed the sx which are most concerning (e.g., sudden worsening pain, fever, inability to tolerate by mouth) that necessitate immediate return. Medications administered to the patient during their visit and any new prescriptions provided to the patient are listed below.  Medications given during this visit Medications  ketorolac (TORADOL) 15 MG/ML injection 15 mg (has no administration in time range)     The patient appears reasonably screen and/or stabilized for discharge and I doubt any other medical condition or other [facility] requiring further screening, evaluation, or treatment in the ED at this time prior to discharge.           Final Clinical Impression(s) / ED Diagnoses Final diagnoses:  Acute pain of right shoulder    Rx / DC Orders ED Discharge Orders     None         Deno Etienne, DO 02/26/22 1744

## 2022-02-28 ENCOUNTER — Encounter: Payer: Self-pay | Admitting: Family Medicine

## 2022-02-28 ENCOUNTER — Ambulatory Visit (HOSPITAL_BASED_OUTPATIENT_CLINIC_OR_DEPARTMENT_OTHER)
Admission: RE | Admit: 2022-02-28 | Discharge: 2022-02-28 | Disposition: A | Discharge status: Home / Self Care | Payer: Commercial Managed Care - HMO | Source: Ambulatory Visit | Attending: Family Medicine | Admitting: Family Medicine

## 2022-02-28 ENCOUNTER — Other Ambulatory Visit (HOSPITAL_BASED_OUTPATIENT_CLINIC_OR_DEPARTMENT_OTHER): Payer: Self-pay

## 2022-02-28 ENCOUNTER — Ambulatory Visit (INDEPENDENT_AMBULATORY_CARE_PROVIDER_SITE_OTHER): Payer: 59 | Admitting: Family Medicine

## 2022-02-28 VITALS — Ht 67.0 in | Wt 330.0 lb

## 2022-02-28 DIAGNOSIS — M5412 Radiculopathy, cervical region: Secondary | ICD-10-CM | POA: Diagnosis present

## 2022-02-28 DIAGNOSIS — M7551 Bursitis of right shoulder: Secondary | ICD-10-CM | POA: Diagnosis not present

## 2022-02-28 MED ORDER — PREDNISONE 5 MG PO TABS
ORAL_TABLET | ORAL | 0 refills | Status: AC
  Filled 2022-02-28: qty 21, 6d supply, fill #0

## 2022-02-28 MED ORDER — GABAPENTIN 300 MG PO CAPS
300.0000 mg | ORAL_CAPSULE | Freq: Three times a day (TID) | ORAL | 1 refills | Status: AC
  Filled 2022-02-28: qty 90, 30d supply, fill #0

## 2022-02-28 NOTE — Assessment & Plan Note (Signed)
Acutely occurring.  Symptoms appear consistent with a bursitis as she has localized pain in the area. -Counseled on home exercise therapy and supportive care. -Could consider injection or physical therapy.

## 2022-02-28 NOTE — Assessment & Plan Note (Signed)
Acute on chronic in nature.  Has a history of injury from years ago.  Most consistent severe pain that originates in the next and with weakness in the right hand, is most consistent with a nerve impingement. -Counseled on home exercise therapy and supportive care. -Prednisone. -Gabapentin. -Neck x-ray. -MRI of the cervical spine to evaluate for nerve impingement and consideration of epidural use.
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
## 2022-02-28 NOTE — Patient Instructions (Signed)
Nice to meet you Please try heat  Please try the exercises  I will call with the xray results.  We will get the MRi at [facility] imaging   Please send me a message in MyChart with any questions or updates.  We will setup a virtual visit once the MRI is resulted.   --Dr. Jordan Likes

## 2022-03-02 ENCOUNTER — Telehealth: Payer: Self-pay | Admitting: Family Medicine

## 2022-03-02 NOTE — Telephone Encounter (Signed)
Unable to leave VM for patient. If she calls back please have her speak with a nurse/CMA and inform that her xray shows signs of spasm but no structural changes. Continue with current plan.   If any questions then please take the best time and phone number to call and I will try to call her back.   Myra Rude, MD [facility] 03/02/2022, 12:51 PM

## 2023-01-01 ENCOUNTER — Encounter: Payer: Self-pay | Admitting: *Deleted

## 2023-04-13 IMAGING — DX DG SHOULDER 2+V*R*
3 series · 3 of 3 positions shown · non-contrast
Comparison: None Available.

CLINICAL DATA: Right shoulder pain

EXAM:
RIGHT SHOULDER - 2+ VIEW

[shoulder grashey]
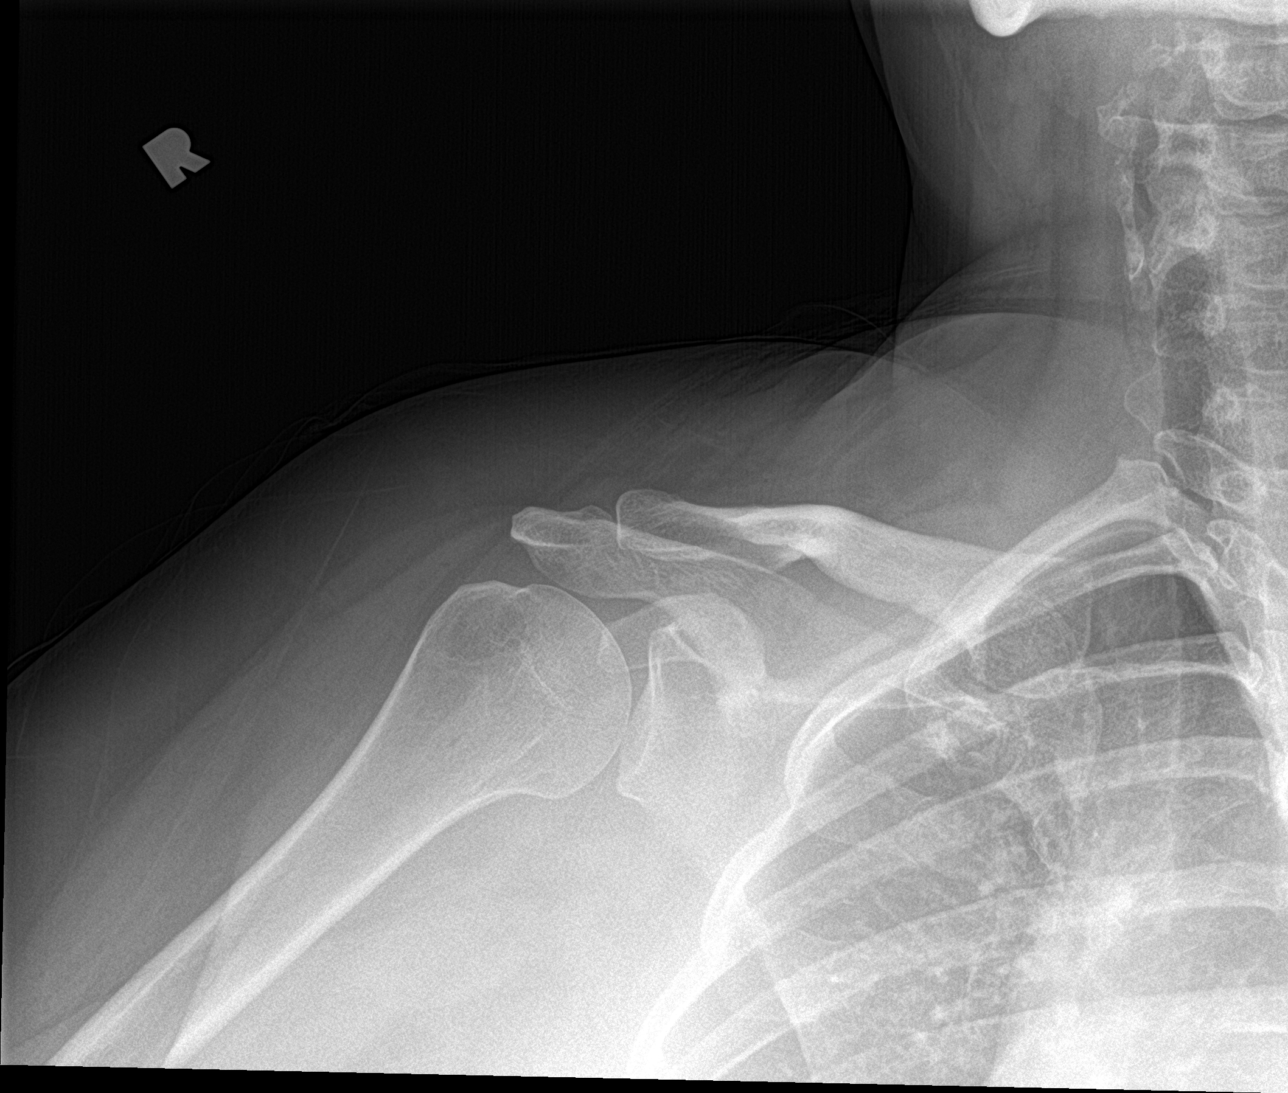

[shoulder y view]
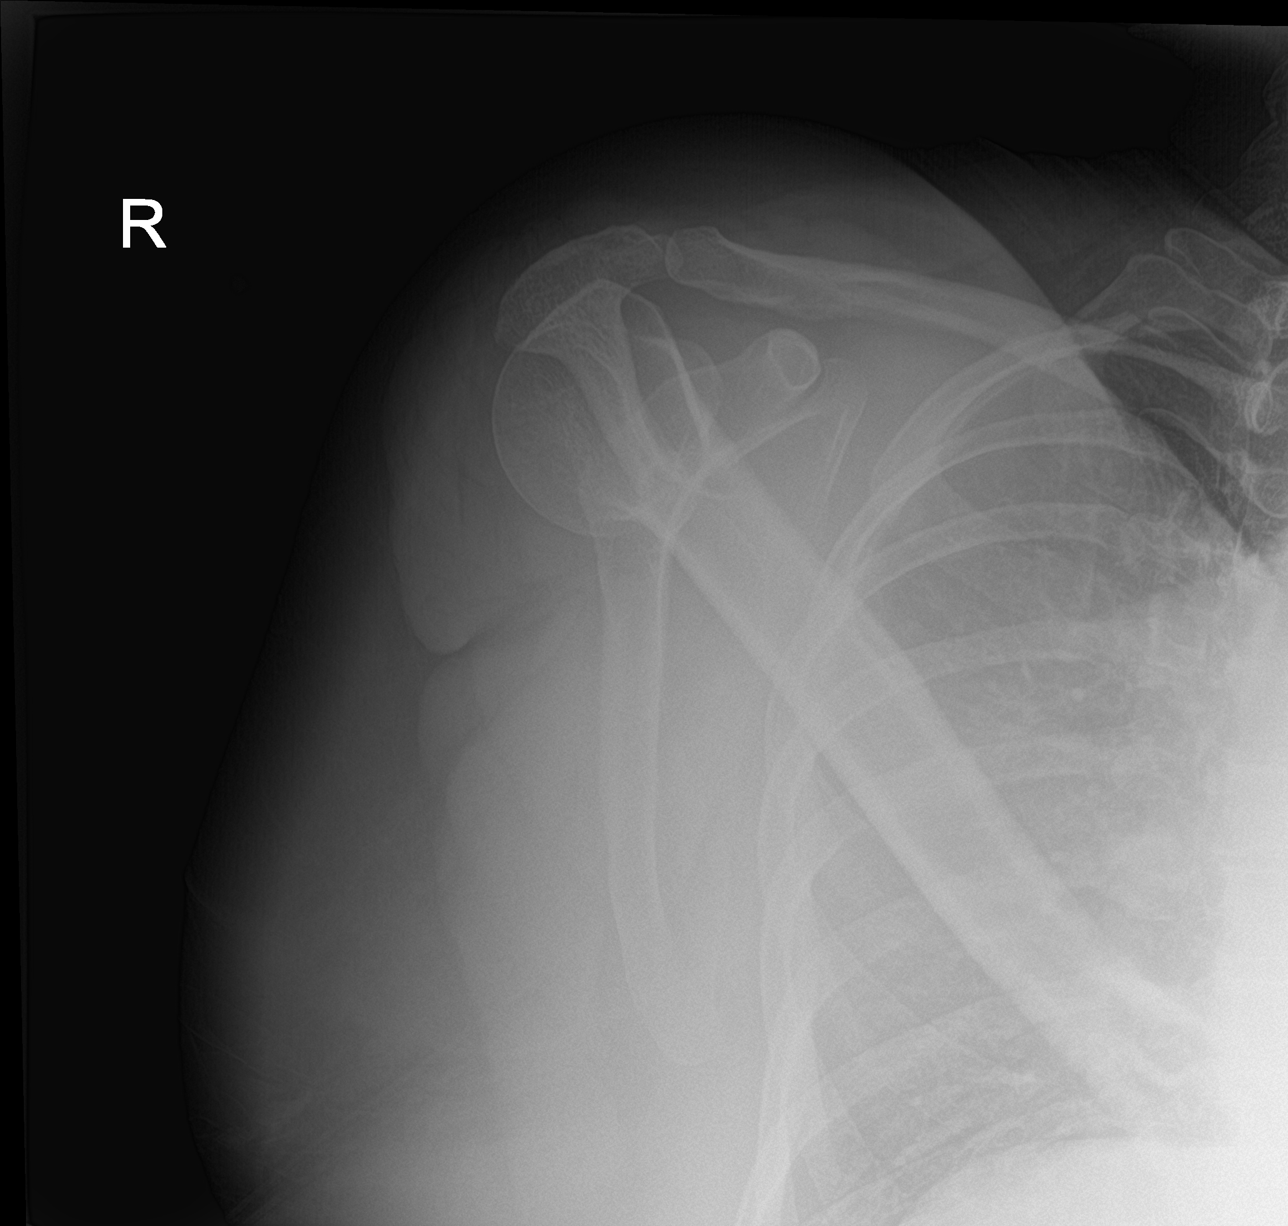

[shoulder axillary]
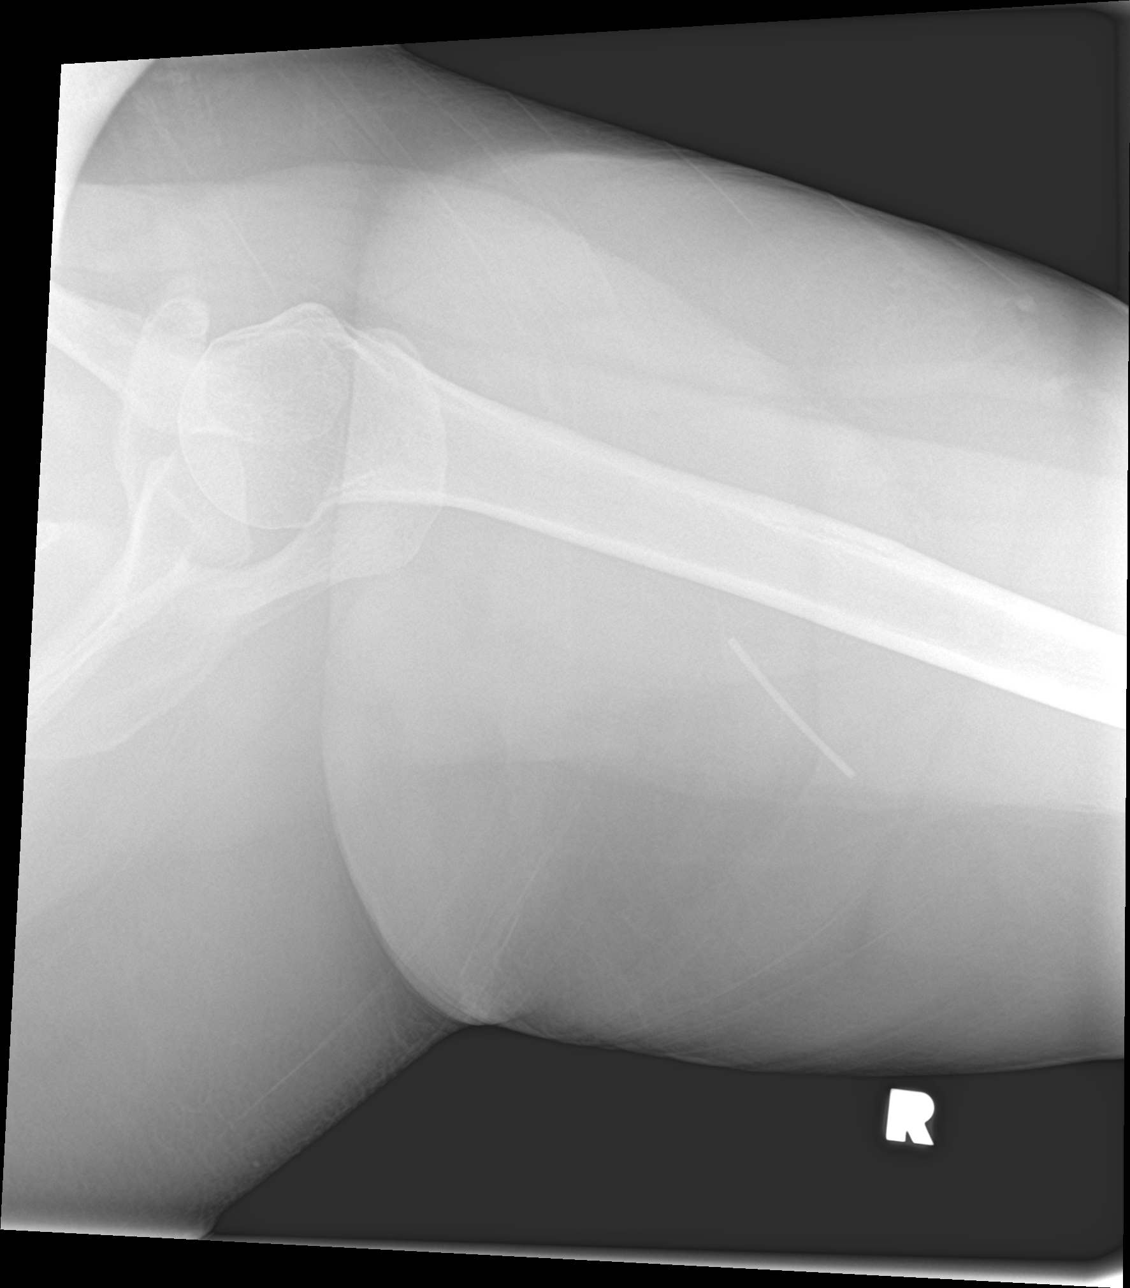

[3 of 3 positions shown; findings below may reference images not displayed]

FINDINGS: There is no evidence of fracture or dislocation. There is no
evidence of arthropathy or other focal bone abnormality. Soft
tissues are unremarkable.
IMPRESSION: Negative.

## 2024-03-24 ENCOUNTER — Ambulatory Visit: Admitting: Family Medicine
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
## 2024-04-22 ENCOUNTER — Encounter: Payer: Self-pay | Admitting: Obstetrics and Gynecology

## 2024-04-27 ENCOUNTER — Ambulatory Visit (INDEPENDENT_AMBULATORY_CARE_PROVIDER_SITE_OTHER): Payer: Self-pay | Admitting: Clinical

## 2024-04-27 DIAGNOSIS — F333 Major depressive disorder, recurrent, severe with psychotic symptoms: Secondary | ICD-10-CM | POA: Diagnosis not present

## 2024-04-27 DIAGNOSIS — F4310 Post-traumatic stress disorder, unspecified: Secondary | ICD-10-CM

## 2024-04-28 ENCOUNTER — Ambulatory Visit (INDEPENDENT_AMBULATORY_CARE_PROVIDER_SITE_OTHER): Admitting: Physician Assistant

## 2024-04-28 VITALS — BP 134/84 | HR 85 | Temp 98.2°F | Ht 67.0 in | Wt 333.0 lb

## 2024-04-28 DIAGNOSIS — F411 Generalized anxiety disorder: Secondary | ICD-10-CM | POA: Diagnosis not present

## 2024-04-28 DIAGNOSIS — F422 Mixed obsessional thoughts and acts: Secondary | ICD-10-CM | POA: Diagnosis not present

## 2024-04-28 DIAGNOSIS — F99 Mental disorder, not otherwise specified: Secondary | ICD-10-CM

## 2024-04-28 DIAGNOSIS — F5105 Insomnia due to other mental disorder: Secondary | ICD-10-CM | POA: Diagnosis not present

## 2024-04-28 DIAGNOSIS — F4310 Post-traumatic stress disorder, unspecified: Secondary | ICD-10-CM

## 2024-04-28 DIAGNOSIS — F333 Major depressive disorder, recurrent, severe with psychotic symptoms: Secondary | ICD-10-CM

## 2024-04-28 MED ORDER — ARIPIPRAZOLE 10 MG PO TABS: 10.0000 mg | ORAL_TABLET | Freq: Every day | ORAL | 2 refills | Status: DC

## 2024-04-28 MED ORDER — TRAZODONE HCL 50 MG PO TABS: 50.0000 mg | ORAL_TABLET | Freq: Every day | ORAL | 2 refills | Status: DC

## 2024-04-28 MED ORDER — HYDROXYZINE HCL 10 MG PO TABS: 10.0000 mg | ORAL_TABLET | Freq: Three times a day (TID) | ORAL | 1 refills | Status: DC | PRN

## 2024-04-28 MED ORDER — DULOXETINE HCL 60 MG PO CPEP: 60.0000 mg | ORAL_CAPSULE | Freq: Every day | ORAL | 2 refills | Status: DC
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
## 2024-04-29 ENCOUNTER — Telehealth (HOSPITAL_COMMUNITY): Payer: Self-pay | Admitting: Clinical

## 2024-04-29 NOTE — Telephone Encounter (Signed)
 Patient called and said that Laura Rodriguez had mentioned that she needed a full psych eval. She is not clear on what she needed to do to follow up on this process. Patient said due to her mental health issues, it took her all day to make the phone call to reach out to you.  The best number to reach her is [phone]

## 2024-05-05 ENCOUNTER — Encounter (HOSPITAL_COMMUNITY): Payer: Self-pay | Admitting: Physician Assistant

## 2024-05-10 NOTE — ED Triage Notes (Signed)
 Pt. Arrives from home with chief complaint of hernia pain since this evening.   Pt. Reports she had constipation x1 week and took dulcolax last night with relief today.

## 2024-05-10 NOTE — ED Provider Notes (Signed)
 Emergency Department Provider Note   History   Chief Complaint  Patient presents with  . Abdominal Pain      History provided by:  Patient Abdominal Pain Pain location:  Periumbilical Pain quality: aching   Pain radiates to:  Does not radiate Pain severity:  Moderate Onset quality:  Sudden Duration:  1 day Timing:  Intermittent Progression:  Resolved Chronicity:  Recurrent Context comment:  Pt has known ventral hernia Associated symptoms: diarrhea      Past Medical History Medical History[1]   Past Surgical History Surgical History[2]    Medications Current Outpatient Medications  Medication Instructions  . albuterol  HFA (PROVENTIL  HFA;VENTOLIN  HFA;PROAIR  HFA) 90 mcg/actuation inhaler 2 puffs, Every 6 hours PRN  . ARIPiprazole  (ABILIFY ) 10 mg, Daily  . dicyclomine  (BENTYL ) 20 mg, 4 times daily  . DULoxetine  (CYMBALTA ) 60 mg, Daily  . glipiZIDE (GLUCOTROL XL) 5 mg, oral, 2 times daily after meals  . hydrOXYzine  (ATARAX ) 10 mg, 3 times daily PRN  . loratadine (CLARITIN) 10 mg  . nystatin (MYCOSTATIN) 100,000 unit/gram cream topical, 2 times daily  . omeprazole (PRILOSEC) 20 mg  . tiZANidine (ZANAFLEX) 2-4 mg, oral, At  bedtime PRN  . traZODone  (DESYREL ) 50 mg, At bedtime  . triamcinolone acetonide (KENALOG) 0.1 % cream topical, 2 times daily      Allergies Allergies[3]   Family History Family History[4]   Social History Social History   Socioeconomic History  . Marital status: Legally Separated    Spouse name: Not on file  . Number of children: Not on file  . Years of education: Not on file  . Highest education level: Not on file  Occupational History  . Not on file  Tobacco Use  . Smoking status: Never    Passive exposure: Never  . Smokeless tobacco: Never  Vaping Use  . Vaping status: Never Used  Substance and Sexual Activity  . Alcohol use: Yes  . Drug use: Not Currently    Comment: Drug use: Denies  . Sexual activity: Not Currently   Other Topics Concern  . Not on file  Social History Narrative   ** Merged History Encounter **       ** Merged History Encounter **       Social Drivers of Health   Food Insecurity: High Risk (03/30/2024)   Food vital sign   . Within the past 12 months, you worried that your food would run out before you got money to buy more: Often true   . Within the past 12 months, the food you bought just didn't last and you didn't have money to get more: Often true  Transportation Needs: Unmet Transportation Needs (03/30/2024)   Transportation   . In the past 12 months, has lack of reliable transportation kept you from medical appointments, meetings, work or from getting things needed for daily living? : Yes  Safety: High Risk (03/30/2024)   Safety   . How often does anyone, including family and friends, physically hurt you?: Rarely   . How often does anyone, including family and friends, insult or talk down to you?: Frequently   . How often does anyone, including family and friends, threaten you with harm?: Never   . How often does anyone, including family and friends, scream or curse at you?: Fairly Often  Living Situation: Medium Risk (03/30/2024)   Living Situation   . What is your living situation today?: I have a place to live today, but I am worried about losing  it in the future   . Think about the place you live. Do you have problems with any of the following? Choose all that apply:: None/None on this list      Review of Systems  Review of Systems  Gastrointestinal:  Positive for abdominal pain and diarrhea.     Physical Exam   ED Triage Vitals [05/10/24 2002]  Temp 98.6 F (37 C)  Heart Rate (!) 111  Resp 20  BP (!) 150/99  MAP (mmHg) 116  SpO2 97 %  O2 Device None (Room air)  O2 Flow Rate (L/min)   Weight (!) 151 kg (332 lb)    Physical Exam Constitutional:      General: She is not in acute distress.    Appearance: Normal appearance. She is obese.  HENT:     Head:  Atraumatic.     Mouth/Throat:     Mouth: Mucous membranes are moist.  Eyes:     Extraocular Movements: Extraocular movements intact.  Cardiovascular:     Rate and Rhythm: Normal rate and regular rhythm.  Pulmonary:     Effort: Pulmonary effort is normal. No respiratory distress.  Abdominal:     General: Abdomen is flat. There is no distension.     Palpations: Abdomen is soft.     Tenderness: There is abdominal tenderness in the periumbilical area. There is no guarding or rebound.     Hernia: A hernia is present. Hernia is present in the ventral area.     Comments: Large ventral hernia, easily reduced on exam.  No skin changes.  Mild tenderness right around the hernia site.  Musculoskeletal:        General: No deformity.     Cervical back: Normal range of motion.  Skin:    General: Skin is warm and dry.  Neurological:     Mental Status: She is alert and oriented to person, place, and time. Mental status is at baseline.  Psychiatric:        Mood and Affect: Mood normal.     Labs   Abnormal Labs Reviewed  URINALYSIS WITH REFLEX TO MICROSCOPIC - Abnormal; Notable for the following components:      Result Value   Specific Gravity, Urine 1.033 (*)    Urobilinogen, Urine 2.0 (*)    All other components within normal limits   Narrative:    Microscopic not indicated  COMPREHENSIVE METABOLIC PANEL - Abnormal; Notable for the following components:   Glucose, Random 200 (*)    Aspartate Aminotransferase (AST) 52 (*)    Alanine Aminotransferase (ALT) 71 (*)    All other components within normal limits    Labs independently reviewed by myself and considered in medical decision making.  Radiology  None  Procedure Note  Procedures  Medical Decision Making   Medical Decision Making Patient is a very pleasant 38 year old female with history as above presents for evaluation of abdominal pain.  She has a known ventral hernia and follows with surgery for this.  She was constipated  yesterday and took some laxatives.  After having some diarrhea today, she reports her hernia was hurting worse than usual.  Since that time, the pain has somewhat subsided.  On exam, she has a large ventral hernia.  This is easily reducible on exam.  Labs were obtained which are overall reassuring.  I reviewed her CT scan from earlier this month.  There is a very large hernia defect making incarceration or obstruction much less likely.  I  do not believe she needs repeat scan today as her hernia was easily reduced and her pain has improved.  Plan to follow-up with general surgery.  Return precautions discussed in detail.  She is not having any obstructive symptoms at this time.  Problems Addressed: Generalized abdominal pain: complicated acute illness or injury Ventral hernia without obstruction or gangrene: complicated acute illness or injury  Amount and/or Complexity of Data Reviewed External Data Reviewed: radiology.    Details: Reviewed CT abdomen from earlier this month. Labs: ordered. Decision-making details documented in ED Course.  Risk Prescription drug management.     ED Clinical Impression   1. Generalized abdominal pain   2. Ventral hernia without obstruction or gangrene      ED Disposition     ED Disposition  Discharge   Condition  Stable   Comment  --           Discharge Medication List as of 05/10/2024 10:43 PM        FOLLOW UP [place] Rig[place], MD [address] [place] [address] [phone]   As needed  [facility] -  EMERGENCY DEPARTMENT [address] [place]  [address] [phone]  As needed, If symptoms worsen        [1] Past Medical History: Diagnosis Date  . Anxiety   . Asthma (CMD)   . Crome syndrome (CMD)   . Depression   . Diabetes mellitus    (CMD)   . Migraine   [2] Past Surgical History: Procedure Laterality Date  . CESAREAN  SECTION    . CHOLECYSTECTOMY    [3] Allergies Allergen Reactions  . Amoxicillin Anaphylaxis  . Doxycycline Anaphylaxis  . Azithromycin Hives  . Ciprofloxacin   . Hydrocodone -Acetaminophen    . Latex   . Lithium Analogues   . Metformin Diarrhea    Abdominal pain it put me in the hospital  [4] Family History Problem Relation Name Age of Onset  . Breast cancer Mother    . Ovarian cancer Mother

## 2024-05-14 ENCOUNTER — Telehealth (HOSPITAL_COMMUNITY): Payer: Self-pay

## 2024-05-14 NOTE — Telephone Encounter (Signed)
 Patient called and reported that she was having blurred vision. The patient's PCP told the patient to call [facility] and let the provider know that it could be the medication. The patient spoke with Anjenette and she suggested the patient go to urgent care or the ER. Anjenette said we would send a message to the provider.  Patient reported that they had called and left messaged for the nursing staff, but had not heard back.

## 2024-05-14 NOTE — Telephone Encounter (Signed)
 Message acknowledged and reviewed. Will follow up with patient in regards to concerns.

## 2024-05-14 NOTE — Progress Notes (Signed)
 Chief Complaint  Patient presents with  . Blurred Vision    Pt stated that she been having blurry vision for a couple of week. Pt stated that she noticed after she stated her new medication for depression     Subjective .  HPI:   Laura Rodriguez is here for blurry visit for almost 1 month.  She reports she has been having blurry vision after she was started on medication for her mental health not sure which have contributes this.  She was started on Cymbalta  but she reports she has tolerated it well, I went through her medication I have advised to stop atarax  for now.  Her HbA1c currently is 6.8  For rest of the medication she will follow up with psychiatry.   Rash under her right breast looks better than before continue with nystatin powder.     Review of Systems  Constitutional: Negative.   HENT: Negative.    Eyes:  Positive for visual disturbance.  Respiratory: Negative.    Cardiovascular: Negative.   Genitourinary: Negative.   Neurological:  Negative for dizziness, facial asymmetry, light-headedness and headaches.  Psychiatric/Behavioral: Negative.       The following portions of the patient's history were reviewed and updated as appropriate: allergies, current medications, past medical history and problem list.   Objective  BP 110/77 (BP Location: Left arm, Patient Position: Sitting)   Pulse 83   Temp 98 F (36.7 C) (Oral)   Resp 16   Wt (!) 150 kg (331 lb 6.4 oz)   LMP 04/13/2024 (Exact Date)   SpO2 96%   BMI 56.88 kg/m   Physical Exam Constitutional:      Appearance: She is obese.  Eyes:     Extraocular Movements: Extraocular movements intact.     Pupils: Pupils are equal, round, and reactive to light.  Skin:    Comments: Rash under her right breast looks better than before and it looks healing now.   Neurological:     General: No focal deficit present.     Mental Status: She is oriented to person, place, and time.     Cranial Nerves: No cranial nerve  deficit.     Sensory: No sensory deficit.     Motor: No weakness.     Coordination: Coordination normal.     Gait: Gait normal.     Deep Tendon Reflexes: Reflexes normal.      Labs: No results found for this or any previous visit (from the past 24 hours).  Assessment/Plan  1. Blurry vision   2. Assistance with transportation    1. Blurry vision (Primary) She reports blurred vision has been there for almost 1 months, I have asked her to hold off on Atarax  and see if that makes things better. I have told her to go to ER if its progressive she reports she will not be able to wait in ER for long time. I have strongly counseled if things get worst its better she go to ER for further assessment and also have requested to follow up with the psychiatry for medication management which her uncle agrees today.   2. Assistance with transportation - Ambulatory referral to Social Work   No follow-ups on file.:  Medications Prescribed: No orders of the defined types were placed in this encounter.    Other orders: Orders Placed This Encounter  Procedures  . Ambulatory referral to Social Work   Plan of care discussed with the patient and she verbalized understanding and agreed  with the plan.
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
## 2024-05-14 NOTE — Telephone Encounter (Signed)
 Since this pt has not yet been seen by Dr. Chien, and was seen by Lytle, please forward this to [facility] to address this concern. I am not able to find him in epic. Thanks

## 2024-05-15 ENCOUNTER — Telehealth (HOSPITAL_COMMUNITY): Payer: Self-pay

## 2024-05-15 NOTE — Telephone Encounter (Signed)
 Patient's Uncle called in requesting a call from provider. Uncle has provider an update on Patient's status. Patient has been released from the hospital. The hospital believes it's not intracranial pressure but do believe it is the Abilify  causing the patient's issue.

## 2024-05-19 ENCOUNTER — Other Ambulatory Visit (HOSPITAL_COMMUNITY): Payer: Self-pay | Admitting: Physician Assistant

## 2024-05-19 DIAGNOSIS — F4310 Post-traumatic stress disorder, unspecified: Secondary | ICD-10-CM

## 2024-05-19 DIAGNOSIS — F333 Major depressive disorder, recurrent, severe with psychotic symptoms: Secondary | ICD-10-CM

## 2024-05-19 DIAGNOSIS — F411 Generalized anxiety disorder: Secondary | ICD-10-CM

## 2024-05-19 DIAGNOSIS — F5105 Insomnia due to other mental disorder: Secondary | ICD-10-CM

## 2024-05-19 DIAGNOSIS — R112 Nausea with vomiting, unspecified: Secondary | ICD-10-CM

## 2024-05-19 DIAGNOSIS — F422 Mixed obsessional thoughts and acts: Secondary | ICD-10-CM

## 2024-05-19 MED ORDER — ESCITALOPRAM OXALATE 5 MG PO TABS: ORAL_TABLET | ORAL | 0 refills | Status: DC

## 2024-05-19 MED ORDER — HYDROXYZINE HCL 10 MG PO TABS: 10.0000 mg | ORAL_TABLET | Freq: Three times a day (TID) | ORAL | 1 refills | Status: DC | PRN

## 2024-05-19 MED ORDER — ESCITALOPRAM OXALATE 10 MG PO TABS: 10.0000 mg | ORAL_TABLET | Freq: Every day | ORAL | 1 refills | Status: DC

## 2024-05-19 MED ORDER — TRAZODONE HCL 100 MG PO TABS: 100.0000 mg | ORAL_TABLET | Freq: Every day | ORAL | 1 refills | Status: DC

## 2024-05-19 MED ORDER — ONDANSETRON HCL 4 MG PO TABS: 4.0000 mg | ORAL_TABLET | ORAL | 0 refills | Status: AC | PRN

## 2024-05-19 NOTE — Telephone Encounter (Signed)
 Provider was able to reach out to patient's uncle [name]).  Patient's uncle informed provider that patient is in the hospital at [facility] in [place], [place] .  He informed provider that patient was experiencing pressure on the brain which was causing her blurry vision.  Patient's uncle informed provider to reach out to him for more updates regarding patient's condition.  Provider was able to reach back out to patient's uncle regarding patient's wellbeing.  Patient's uncle informed provider that the pressure in her eyes has gotten better and her vision is starting to return.  Patient's uncle states that patient's medical team believe that patient's orbital pressure/intracranial pressure may have been attributed to her use of Abilify .  Patient was instructed to discontinue her use of Abilify .  Patient is still continuing to use her trazodone  and was given a dose of the medication the previous night.  Patient appears to be continuing to take Cymbalta  60 mg daily.  Provider informed patient's uncle that he would be contacted to discuss patient's condition and for medication management.  Patient's uncle vocalized understanding.

## 2024-05-19 NOTE — Telephone Encounter (Signed)
 Provider was able to reach out to patient's uncle [name]) regarding patient's blurry vision possibly associated with the use of her current medication regimen.  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  Patient's uncle informed provider that patient will be undergoing a CT scan due to her blurry vision.

## 2024-05-19 NOTE — Progress Notes (Signed)
 Provider was able to reach out to patient's uncle regarding patient's wellbeing.  Patient's uncle states that the patient has not been doing well since she was discharged from the hospital.  He reports that the patient has taken herself off all of her medications.  He reports that the patient stopped taking duloxetine  because she stated that it prevented her from doing her work due to fatigue.  He reports that the patient admits to using Atarax  at least twice a day.  He believes that patient's fatigue/tiredness may be coming from her use of Atarax .  After talking with patient's uncle, provider was able to reach out to patient to discuss medication management with the patient.  Patient informed provider that she stopped taking all of her medications after being diagnosed with idiopathic intracranial hypertension.  She informed provider that she is unsure if her use of Abilify  because her idiopathic intracranial hypertension (IIH) but believes that it may have worsened her symptoms.  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  Since discontinuing her medications, patient reports that she has been experiencing worsening mood, seeing black shadows, and hearing voices.  Patient is interested in being placed on medication that she has previously taken in the past.  She informed provider that she refuses to take Zoloft, lithium, or Abilify .  Provider instructed patient to continue taking trazodone .  Patient reports that she only receives roughly an hour to 2 hours of sleep per night with trazodone  and states that she had to increase her dosage of melatonin since being placed on trazodone .  Provider recommended increasing patient's trazodone  dosage from 50 mg to 100 mg at bedtime for the management of her sleep.  Patient to continue taking hydroxyzine  10 mg 3 times daily as needed for the management of her anxiety.  In regards to the management of her depression, provider recommended  rechallenging the use of Lexapro .  Provider recommended patient be placed on Lexapro  5 mg for 6 days, followed by 10 mg daily for the management of her depressive symptoms, anxiety, and OCD like symptoms.  Patient was agreeable to recommendation.  Provider discussed side effects associated with the use of Lexapro  prior to the conclusion of the phone encounter.  Patient verbalized understanding.  Patient expressed concerns over experiencing nausea from her use of Lexapro .  Provider to preemptively prescribe Zofran  4 mg as needed (30-day supply) for the management of nausea associated with the use of Lexapro .  Patient's uncle was present during the discussion of patient's medication management.  Patient medications to be e-prescribed to pharmacy of choice.

## 2024-05-20 ENCOUNTER — Other Ambulatory Visit: Payer: Self-pay

## 2024-05-20 ENCOUNTER — Encounter (HOSPITAL_BASED_OUTPATIENT_CLINIC_OR_DEPARTMENT_OTHER): Payer: Self-pay | Admitting: Emergency Medicine

## 2024-05-20 ENCOUNTER — Emergency Department (HOSPITAL_BASED_OUTPATIENT_CLINIC_OR_DEPARTMENT_OTHER)

## 2024-05-20 ENCOUNTER — Other Ambulatory Visit (HOSPITAL_BASED_OUTPATIENT_CLINIC_OR_DEPARTMENT_OTHER): Payer: Self-pay

## 2024-05-20 ENCOUNTER — Emergency Department (HOSPITAL_BASED_OUTPATIENT_CLINIC_OR_DEPARTMENT_OTHER)
Admission: EM | Admit: 2024-05-20 | Discharge: 2024-05-20 | Disposition: A | Discharge status: Home / Self Care | Attending: Emergency Medicine | Admitting: Emergency Medicine

## 2024-05-20 DIAGNOSIS — S6992XA Unspecified injury of left wrist, hand and finger(s), initial encounter: Secondary | ICD-10-CM | POA: Diagnosis not present

## 2024-05-20 DIAGNOSIS — Z79899 Other long term (current) drug therapy: Secondary | ICD-10-CM | POA: Diagnosis not present

## 2024-05-20 DIAGNOSIS — Z91040 Latex allergy status: Secondary | ICD-10-CM | POA: Insufficient documentation

## 2024-05-20 DIAGNOSIS — R300 Dysuria: Secondary | ICD-10-CM | POA: Diagnosis not present

## 2024-05-20 DIAGNOSIS — S6991XA Unspecified injury of right wrist, hand and finger(s), initial encounter: Secondary | ICD-10-CM | POA: Insufficient documentation

## 2024-05-20 LAB — URINALYSIS, ROUTINE W REFLEX MICROSCOPIC
Bilirubin Urine: NEGATIVE
Glucose, UA: 100 mg/dL — AB
Ketones, ur: NEGATIVE mg/dL
Leukocytes,Ua: NEGATIVE
Nitrite: NEGATIVE
Protein, ur: NEGATIVE mg/dL
Specific Gravity, Urine: 1.025 (ref 1.005–1.030)
pH: 6 (ref 5.0–8.0)

## 2024-05-20 LAB — URINALYSIS, MICROSCOPIC (REFLEX): WBC, UA: NONE SEEN WBC/hpf (ref 0–5)

## 2024-05-20 MED ORDER — CEPHALEXIN 500 MG PO CAPS
500.0000 mg | ORAL_CAPSULE | Freq: Four times a day (QID) | ORAL | 0 refills | Status: DC
Start: 2024-05-20 — End: 2024-05-20
  Filled 2024-05-20: qty 20, 5d supply, fill #0

## 2024-05-20 MED ORDER — CEPHALEXIN 500 MG PO CAPS: 500.0000 mg | ORAL_CAPSULE | Freq: Four times a day (QID) | ORAL | 0 refills | Status: AC

## 2024-05-20 NOTE — ED Provider Notes (Signed)
 [facility] Provider Note   CSN: [id number] Arrival date & time: 05/20/24  1535     Patient presents with: Hand Injury   Laura Rodriguez is a 38 y.o. female with a past medical history significant for asthma who presents to the ED due to bilateral hand pain.  Patient states she became upset earlier today when her weight loss surgery was canceled so she punched a concrete door multiple times with both hands.  Patient admits to significant bruising to both hands.  No other injuries.  Denies numbness/tingling.  Also admits to some dysuria that started earlier today.  History of frequent UTIs.  Believes she has another UTI.  No flank or back pain.  Denies abdominal pain.  No fever or chills.  Denies vaginal discharge.  History obtained from patient and past medical records. No interpreter used during encounter.       Prior to Admission medications   Medication Sig Start Date End Date Taking? Authorizing Provider  cephALEXin  (KEFLEX ) 500 MG capsule Take 1 capsule (500 mg total) by mouth 4 (four) times daily. 11/28/21   Zackowski, Scott, MD  dicyclomine  (BENTYL ) 20 MG tablet Take 1 tablet (20 mg total) by mouth 3 (three) times daily as needed for spasms (abdominal cramping). 08/06/21   Long, Joshua G, MD  diphenoxylate -atropine  (LOMOTIL ) 2.5-0.025 MG tablet Take 1 tablet by mouth 4 (four) times daily as needed for diarrhea or loose stools. 08/06/21   Long, Fonda MATSU, MD  escitalopram  (LEXAPRO ) 10 MG tablet Take 1 tablet (10 mg total) by mouth daily. 05/19/24 05/19/25  Nwoko, Uchenna E, PA  escitalopram  (LEXAPRO ) 5 MG tablet Patient to take escitalopram  5 mg daily for 6 days, followed by 10 mg daily. 05/19/24   Nwoko, Uchenna E, PA  gabapentin  (NEURONTIN ) 300 MG capsule Take 1 capsule (300 mg total) by mouth 3 (three) times daily. 02/28/22   Chick Venetia BRAVO, MD  hydrOXYzine  (ATARAX ) 10 MG tablet Take 1 tablet (10 mg total) by mouth 3 (three) times daily  as needed. 05/19/24   Nwoko, Uchenna E, PA  ondansetron  (ZOFRAN  ODT) 4 MG disintegrating tablet Take 1 tablet (4 mg total) by mouth every 8 (eight) hours as needed. 08/06/21   Long, Fonda MATSU, MD  ondansetron  (ZOFRAN ) 4 MG tablet Take 1 tablet (4 mg total) by mouth as needed for nausea or vomiting. 05/19/24   Nwoko, Uchenna E, PA  predniSONE  (DELTASONE ) 5 MG tablet Take 6 Tablets by mouth for first day, 5 tabs second day, 4 tabs third day, 3 tabs fourth day, 2 tabs the fifth day, and 1 tab sixth day. 02/28/22   Chick Venetia BRAVO, MD  traZODone  (DESYREL ) 100 MG tablet Take 1 tablet (100 mg total) by mouth at bedtime. 05/19/24   Nwoko, Uchenna E, PA    Allergies: Amoxicillin, Doxycycline, Latex, and Zithromax [azithromycin]    Review of Systems  Genitourinary:  Positive for dysuria.  Musculoskeletal:  Positive for arthralgias.  Skin:  Positive for color change.    Updated Vital Signs BP (!) 139/98 (BP Location: Left Wrist)   Pulse 97   Temp 98.6 F (37 C) (Oral)   Resp 15   Wt (!) 149.7 kg   SpO2 98%   BMI 51.69 kg/m   Physical Exam Vitals and nursing note reviewed.  Constitutional:      General: She is not in acute distress.    Appearance: She is not ill-appearing.  HENT:  Head: Normocephalic.  Eyes:     Pupils: Pupils are equal, round, and reactive to light.  Cardiovascular:     Rate and Rhythm: Normal rate and regular rhythm.     Pulses: Normal pulses.     Heart sounds: Normal heart sounds. No murmur heard.    No friction rub. No gallop.  Pulmonary:     Effort: Pulmonary effort is normal.     Breath sounds: Normal breath sounds.  Abdominal:     General: Abdomen is flat. There is no distension.     Palpations: Abdomen is soft.     Tenderness: There is no abdominal tenderness. There is no guarding or rebound.  Musculoskeletal:        General: Normal range of motion.     Cervical back: Neck supple.     Comments: Bruising to multiple knuckles of bilateral hands.  Full range  of motion of all fingers and bilateral wrist.  Radial pulse intact bilaterally.  Does have tenderness throughout medial aspect of right wrist.  Skin:    General: Skin is warm and dry.  Neurological:     General: No focal deficit present.     Mental Status: She is alert.  Psychiatric:        Mood and Affect: Mood normal.        Behavior: Behavior normal.     (all labs ordered are listed, but only abnormal results are displayed) Labs Reviewed  URINALYSIS, ROUTINE W REFLEX MICROSCOPIC - Abnormal; Notable for the following components:      Result Value   Glucose, UA 100 (*)    Hgb urine dipstick LARGE (*)    All other components within normal limits  URINALYSIS, MICROSCOPIC (REFLEX) - Abnormal; Notable for the following components:   Bacteria, UA FEW (*)    All other components within normal limits  URINE CULTURE    EKG: None  Radiology: DG Hand Complete Right Result Date: 05/20/2024 CLINICAL DATA:  injury EXAM: RIGHT HAND - COMPLETE 3+ VIEW COMPARISON:  None Available. FINDINGS: Right hand No acute fracture or dislocation. There is no evidence of arthropathy or other focal bone abnormality. Soft tissues are unremarkable. No radiopaque foreign body. Left hand No acute fracture or dislocation. There is no evidence of arthropathy or other focal bone abnormality. Soft tissues are unremarkable. No radiopaque foreign body. IMPRESSION: No acute fracture or dislocation in either hand. Electronically Signed   By: Rogelia Myers M.D.   On: 05/20/2024 17:07   DG Hand Complete Left Result Date: 05/20/2024 CLINICAL DATA:  injury EXAM: LEFT HAND - COMPLETE 3+ VIEW COMPARISON:  None Available. FINDINGS: Right hand No acute fracture or dislocation. There is no evidence of arthropathy or other focal bone abnormality. Soft tissues are unremarkable. No radiopaque foreign body. Left hand No acute fracture or dislocation. There is no evidence of arthropathy or other focal bone abnormality. Soft tissues are  unremarkable. No radiopaque foreign body. IMPRESSION: No acute fracture or dislocation in either hand. Electronically Signed   By: Rogelia Myers M.D.   On: 05/20/2024 17:07   DG Wrist Complete Right Result Date: 05/20/2024 CLINICAL DATA:  injury EXAM: RIGHT WRIST - COMPLETE 3+ VIEW COMPARISON:  None Available. FINDINGS: No acute fracture or dislocation. Negative ulnar variance. There is no evidence of arthropathy or other focal bone abnormality. Soft tissues are unremarkable. IMPRESSION: No acute fracture or dislocation. Electronically Signed   By: Rogelia Myers M.D.   On: 05/20/2024 16:59     Procedures  Medications Ordered in the ED - No data to display                                  Medical Decision Making Amount and/or Complexity of Data Reviewed Labs: ordered. Decision-making details documented in ED Course. Radiology: ordered and independent interpretation performed. Decision-making details documented in ED Course.   This patient presents to the ED for concern of hand injury, this involves an extensive number of treatment options, and is a complaint that carries with it a high risk of complications and morbidity.  The differential diagnosis includes bony fracture, dislocation, sprain, etc  38 year old female presents to the ED due to bilateral hand pain after punching a concrete door multiple times.  Also admits to some dysuria that started today.  Frequent UTIs.  No abdominal pain, flank pain, or back pain.  Denies fever or chills.  Upon arrival patient afebrile, not tachycardic or hypoxic.  Patient in no acute distress.  Abdomen soft, nondistended, nontender.  No CVA tenderness bilaterally.  UA to rule out UTI.  Patient does have bruising throughout knuckles to bilateral hands.  Some tenderness on medial aspect of right wrist.  Full range of motion of bilateral wrist and all fingers.  Bilateral upper extremities neurovascularly intact with soft compartments.  Low suspicion for  compartment syndrome.  X-rays ordered to rule out bony fractures.  Patient declined any pain medication.  X-rays personally reviewed and interpreted which are negative for any bony fractures.  Right wrist placed in Velcro splint for comfort. No snuff box tenderness of either hands. UA with few bacteria and no WBC. I discussed with patient about her UA results that they are not very convincing for a UTI; however patient is insistent that she has a UTI and is requesting antibiotics. Urine culture pending. Patient discharged with keflex . Patient stable for discharge. Strict ED precautions discussed with patient. Patient states understanding and agrees to plan. Patient discharged home in no acute distress and stable vitals   No PCP Hx asthma    Final diagnoses:  Injury of right hand, initial encounter  Injury of left hand, initial encounter  Dysuria    ED Discharge Orders     None          Lorelle Aleck JAYSON DEVONNA 05/20/24 1745    Geraldene Hamilton, MD 05/21/24 1642

## 2024-05-20 NOTE — ED Triage Notes (Signed)
 Bilateral hand injury after punching a concrete filled door she said .  Bruising noted to right hand .

## 2024-05-20 NOTE — Discharge Instructions (Signed)
 It was a pleasure taking care of you today.  As discussed, your x-rays did not show any broken bones.  Your urine did not show evidence of a UTI however, I am sending you home with antibiotics per your request. Urine culture is pending. Return to the ER for new or worsening symptoms

## 2024-05-22 LAB — URINE CULTURE
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
## 2024-06-09 ENCOUNTER — Encounter (HOSPITAL_COMMUNITY): Admitting: Psychiatry
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
## 2024-06-11 ENCOUNTER — Ambulatory Visit (INDEPENDENT_AMBULATORY_CARE_PROVIDER_SITE_OTHER): Admitting: Psychiatry

## 2024-06-11 VITALS — BP 124/85 | HR 79

## 2024-06-11 DIAGNOSIS — F4310 Post-traumatic stress disorder, unspecified: Secondary | ICD-10-CM

## 2024-06-11 DIAGNOSIS — F331 Major depressive disorder, recurrent, moderate: Secondary | ICD-10-CM | POA: Diagnosis not present

## 2024-06-11 DIAGNOSIS — F99 Mental disorder, not otherwise specified: Secondary | ICD-10-CM

## 2024-06-11 DIAGNOSIS — R4587 Impulsiveness: Secondary | ICD-10-CM

## 2024-06-11 DIAGNOSIS — F411 Generalized anxiety disorder: Secondary | ICD-10-CM

## 2024-06-11 DIAGNOSIS — F422 Mixed obsessional thoughts and acts: Secondary | ICD-10-CM

## 2024-06-11 DIAGNOSIS — F5105 Insomnia due to other mental disorder: Secondary | ICD-10-CM | POA: Diagnosis not present

## 2024-06-11 MED ORDER — ESCITALOPRAM OXALATE 10 MG PO TABS: 10.0000 mg | ORAL_TABLET | Freq: Every day | ORAL | 1 refills | Status: DC

## 2024-06-11 MED ORDER — TRAZODONE HCL 100 MG PO TABS: 100.0000 mg | ORAL_TABLET | Freq: Every day | ORAL | 1 refills | Status: DC

## 2024-06-11 MED ORDER — HYDROXYZINE HCL 10 MG PO TABS: 10.0000 mg | ORAL_TABLET | Freq: Two times a day (BID) | ORAL | 1 refills | Status: AC

## 2024-06-11 MED ORDER — LAMOTRIGINE 25 MG PO TABS: 25.0000 mg | ORAL_TABLET | Freq: Every day | ORAL | 1 refills | Status: DC

## 2024-06-15 NOTE — Progress Notes (Signed)
  [facility] [address] [place], [place] [address] P: [phone]    F: [phone]  Result: Approved  Therapist was consulted to evaluate the appropriateness of therapy services for the patient with this provider. Following chart review, the patient was deemed appropriate for services and has been approved to be scheduled for an intake appointment, pending availability. This information was communicated to referral team.  Rocky Brunswick, MSW, LCSW Date and Time: 06/15/24 2:32 PM

## 2024-06-17 NOTE — Progress Notes (Signed)
 Subjective    Patient ID:   Laura Rodriguez is a 38 y.o. female   Chief Complaint Chief Complaint  Patient presents with  . Transfer Care    Pt is not fasting.   [name] Health Maintenance    Pap smear is scheduled for 08/07/2024 with GYN.   . Diabetes    Pt reports compliance Glipizide. She wants to start Ozempic. Pt does not check blood glucose at home.   . Dental Pain    C/O dental pain secondary to hole in the top of the right upper mouth, present x 1 year. Pt states she has reoccurring abscess at the site. She requests antibioitc.   . Asthma    Pt is no rescue inhaler. She requests a maintenance inhaler.      Dental Pain  This is a new problem. The current episode started in the past 7 days (3 weeks). The pain is at a severity of 4/10. Associated symptoms include facial pain and oral bleeding. Pertinent negatives include no difficulty swallowing, fever, sinus pressure or thermal sensitivity. She has tried acetaminophen  for the symptoms.     Patient is a transfer care from Dr. Pokharel  DM- glipizide.  Wants to start ozempic.  Asthma, wheezing, intefering with exercising.    She was diagnosed with Migraine headache before, she takes Motrin/tylenol  for her headache which does not help her at all. When she gets headache she gets nausea, vertigo.   She reported she has multiple trauma the past, she fell from height/ deep freezer fell on her body and since then she is having worst back pain along with pain all over her body.    DM she reports her Last HbA1c is 13 and she did not follow back as she has lost her insurance.   Obesity she lost 183 lbs in the past currently her BMI is 53.25. She declined to see the weight management clinic. She is opting to look for surgical option.   Migraine She agreed for neurology referral  History for cervical/ovarian cancer?? Has not done anything extensive, not sure, she reports she was diagnosed on 2019 on September.   She has history of  abdominal hernia and has seen surgeon in the past who recommended to loose weight. She declined to be seen by weight management clinic.   Insomnia previous provider advised to try magnesium glycinate 200 mg or 400 mg. She reports melatonin did not help.  Previous provider advised her to follow-up with psychiatrist.  She used to drink heavily in the past, she never smoked, never used any recreational drugs.   She reported she has multiple trauma the past, she fell from height/ deep freezer fell on her body and since then she is having worst back pain along with pain all over her body.   She is not physically active, She basically eats chicken rice and broccoli      Assessment/Plan:     1. Transfer requested for continuity of care Transfer from Dr. Pokharel    2. Type 2 diabetes mellitus without complication, without long-term current use of insulin    (CMD) (Primary) Will send in Ozempic for type 2 diabetes and weight loss. Patient is unable to exercise due to asthma and weight.  - semaglutide (OZEMPIC) 0.25 mg or 0.5 mg(2 mg/1.5 mL) subcutaneous pen injector; Inject 0.2 mL (0.25 mg total) under the skin every 7 days.  Dispense: 3 mL; Refill: 0    4. Migraine without status migrainosus, not intractable, unspecified migraine  type Managed by Dr. Leopoldo.  5. Intracranial hypertension Managed by Dr. Leopoldo. Spinal tap 06-23-2024, no meds for IIH, Recent hospitalization, for IIH.  With blurred vision.  - semaglutide (OZEMPIC) 0.25 mg or 0.5 mg(2 mg/1.5 mL) subcutaneous pen injector; Inject 0.2 mL (0.25 mg total) under the skin every 7 days.  Dispense: 3 mL; Refill: 0  6. Blurry vision See #5  7. PTSD (post-traumatic stress disorder) See #9  9. Mixed obsessional thoughts and acts Managed by Dr. Graham, [facility].  Is prescribed Lamictal  Lexapro  Ativan trazodone   10. Insomnia due to other mental disorder Dr. Pokharel suggested magnesium glycinate 200 to 400 mg nightly.  11.  Morbid obesity (CMD) Eat heart healthy diet:  fruits, vegetables, whole grains, legumes (e.g., beans, lentils), nuts, fish, and seafood and chicken.  Limit sweets, sugary foods and drinks, fast food, processed foods, red meats and full-fat dairy products.   Don't use tobacco products and limit alcohol.   Exercise 150 minutes per week.   Maintain healthy weight. If overweight, lose 5-7% of body weight.     12. Crohn's disease of small intestine with complication    (CMD) Takes Bentyl . Patient has a pending referral to GI.   13. Chronic low back pain with bilateral sciatica, unspecified back pain laterality Pain mgmt- referred patient to chiropractor and acupuncturist.  Lumbar spine.  14. Moderate persistent asthma without complication (CMD) Patient has not had any pulmonary function test.  States her asthma is worsening uses albuterol  inhaler daily. - Ambulatory referral to Pulmonology; Future  15.  Abscess Sending in clindamycin for dental abscess.  Patient is due to have  Follow up: Return in about 3 months (around 09/17/2024) for Annual physical, chronic care with PCP.  This plan was dicussed with the patient who expressed understanding. Reviewed worrisome signs and symptoms to watch for and patient instructed to call or return to office for any concerns.  Written patient instructions were provided at the end of the visit to enforce goals of care.  Depression Screening:  Most recent PHQ-2 results: Patient Health Questionnaire-2 Score: 5 (06/17/2024  9:18 AM)  Most recent PHQ-9 result: Patient Health Questionnaire-9 Score: 17 (06/17/2024  9:18 AM)  PHQ-9 Question # 9 Thoughts that you would be better off dead or hurting yourself in some way: Not at all (06/17/2024  9:18 AM)  Interpretation:   PHQ-9 Interpretation: Positive (Moderately Severe Depression Severity) (06/17/2024  9:18 AM)      Objective     Vitals:   06/17/24 0926  BP: 110/70  BP Location: Left arm   Patient Position: Sitting  Pulse: 72  Resp: 16  Temp: 98.2 F (36.8 C)  TempSrc: Oral  SpO2: 96%  Weight: (!) 153 kg (337 lb 8 oz)  Height: 1.626 m (5' 4)    Review of Systems  Constitutional:  Negative for fever.  HENT:  Negative for sinus pressure.     Physical Exam Vitals and nursing note reviewed.  Constitutional:      General: She is not in acute distress.    Appearance: Normal appearance. She is obese. She is not ill-appearing.  HENT:     Head: Normocephalic and atraumatic.     Right Ear: Hearing, tympanic membrane, ear canal and external ear normal.     Left Ear: Hearing, tympanic membrane, ear canal and external ear normal.     Nose: Nose normal.     Mouth/Throat:     Lips: Pink.     Mouth: Mucous membranes  are moist.     Pharynx: Oropharynx is clear. Uvula midline. No oropharyngeal exudate or posterior oropharyngeal erythema.   Eyes:     General: Lids are normal.     Extraocular Movements: Extraocular movements intact.     Conjunctiva/sclera: Conjunctivae normal.     Pupils: Pupils are equal, round, and reactive to light.     Comments: No proptosis.   Neck:     Thyroid: No thyromegaly.     Trachea: Trachea normal.  Cardiovascular:     Rate and Rhythm: Normal rate and regular rhythm.     Pulses: Normal pulses.          Radial pulses are 2+ on the right side and 2+ on the left side.       Dorsalis pedis pulses are 2+ on the right side and 2+ on the left side.       Posterior tibial pulses are 2+ on the right side and 2+ on the left side.     Heart sounds: Normal heart sounds. No murmur heard.    No friction rub. No gallop.  Pulmonary:     Effort: Pulmonary effort is normal. No respiratory distress.     Breath sounds: Normal breath sounds. No stridor. No decreased breath sounds, wheezing, rhonchi or rales.  Abdominal:     General: Bowel sounds are normal.     Palpations: Abdomen is soft.     Tenderness: There is no abdominal tenderness.   Genitourinary:    Comments: deferred Musculoskeletal:        General: Normal range of motion.     Cervical back: Normal range of motion.     Right lower leg: No edema.     Left lower leg: No edema.  Lymphadenopathy:     Cervical: No cervical adenopathy.  Skin:    General: Skin is warm and dry.  Neurological:     General: No focal deficit present.     Mental Status: She is alert and oriented to person, place, and time.     GCS: GCS eye subscore is 4. GCS verbal subscore is 5. GCS motor subscore is 6.  Psychiatric:        Attention and Perception: Attention normal.        Mood and Affect: Mood normal.        Speech: Speech normal.        Behavior: Behavior normal. Behavior is cooperative.        Thought Content: Thought content normal. Thought content does not include homicidal or suicidal ideation.        Cognition and Memory: Cognition normal.        Judgment: Judgment normal.

## 2024-06-18 NOTE — Telephone Encounter (Addendum)
 PA Status  approved  Medication OZEMPIC  Provider [employer]  Insurance Company Medicaid   Method   Approval Dates  PA Case ID   covermymeds   06/18/2024-06/18/2025     Portal message has been sent to patient with status update.

## 2024-06-23 NOTE — Progress Notes (Signed)
 LUMBAR PUNCTURE FOR IIH  Diagnostic Lumbar Spinal Puncture  Date/Time: 06/23/2024 10:30 AM  Performed by: Camellia Krystal Custard, MD Authorized by: Camellia Krystal Custard, MD   Consent:    Consent obtained:  Verbal   Consent given by:  Patient   Risks, benefits, and alternatives were discussed: yes     Risks discussed:  Bleeding, infection, pain, repeat procedure, nerve damage and headache   Alternatives discussed:  No treatment Universal protocol:    Procedure explained and questions answered to patient or proxy's satisfaction: yes     Site/side marked: yes     Patient identity confirmed:  Verbally with patient Pre-procedure details:    Procedure purpose:  Diagnostic Anesthesia:    Anesthesia method:  Local infiltration   Local anesthetic:  Lidocaine 2% w/o epi Procedure details:    Lumbar space:  L3-L4 interspace   Patient position:  L lateral decubitus   Needle gauge:  20   Needle length (in): 8 inch.   Ultrasound guidance: Fluoroscopy guided.     Number of attempts:  1   Opening pressure (cm H2O):  27   Fluid appearance:  Clear   Tubes of fluid:  4   Total volume (ml):  32 Post-procedure details:    Puncture site:  Adhesive bandage applied   Procedure completion:  Tolerated Comments:     After informed consent patient was brought to procedure room due to left shoulder pain she had therapy in the left decubitus with left shoulder upward position.  Skin was prepped 2% lidocaine for local anesthesia.  An 8 inch spinal needle was inserted between L3 and 4 opening pressure was 28 but had to subtract a centimeter due to head position some recorded is a 27 cm of H2O.  Approximately 30 to 32 cc of clear fluid was obtained.  Patient sent to recovery.  06/23/2024  Laura Rodriguez  [id number]  Laura Almarie Domino, NP   Chief complaint:  Patient is here for the following No chief complaint on file. Laura Rodriguez  HPI: History of Present Illness    Problem List[1]  Current Medications[2]  Tobacco Use:  Low Risk  (06/17/2024)   Patient History   . Smoking Tobacco Use: Never   . Smokeless Tobacco Use: Never   . Passive Exposure: Never    REVIEW OF SYSTEMS:   Review of Systems  There were no vitals filed for this visit. There is no height or weight on file to calculate BMI.      PHYSICAL EXAM:   GEN: Well appearing, no apparent distress  NECK: supple  SKIN: Normal.  NEURO:  Alert and Oriented times three, Normal speech and language. Patient gives a coherent history and has a normal train of thought. Recent and remote memory appear to be normal.  Normal attention and concentration. CRANIAL NERVES:  Extraocular motions are intact no diplopia.  Normal facial symmetry and sensation.  Uvula and palate upward and midline.  Sensory normal to light touch throughout  Motor moves all extremities well, no abnormal movements. No evidence of tremor.  GAIT: Normal walking. Gait is normal not wide-based.   Assessment & Plan 38 year old female with history of intracranial hypertension here for her lumbar puncture with a pressure of 27 cm of H2O.  I explained the condition to patient explained to her pressure and the significance of it.  Will start patient on Diamox on Thursday.  Beginning with 1 a day and then twice a day as tolerated.  She has an appointment  with ophthalmology next month I put in a request to see if we can go ahead and do her visual field to soon as possible. Her MRI MR venogram has been ordered.  Next will evaluate for possible sleep apnea.  An order for weight loss clinic has also been placed.  Treatment and need for treatment explained.  Explained concern of visual loss prevention.    Diagnoses and all orders for this visit:  Intracranial hypertension -     POC HCG Qualitative, Urine -     FL LP W Fluoro Diagnostic -     Total Protein, CSF -     Glucose, CSF -     CSF Culture -     Cell Count, Body Fluid -     Ambulatory referral to Ophthalmology; Future  New onset  of headaches -     POC HCG Qualitative, Urine -     FL LP W Fluoro Diagnostic -     Total Protein, CSF -     Glucose, CSF -     CSF Culture -     Cell Count, Body Fluid -     Ambulatory referral to Ophthalmology; Future  Other orders -     acetaZOLAMIDE (DIAMOX) 500 mg capsule; Take 1 capsule (500 mg total) by mouth 2 (two) times a day. -     Diagnostic Lumbar Spinal Puncture      Call or go to ER if any acute changes.  Safety and side effects discussed. All question answered.    I agree the documentation is accurate and complete.  Electronically signed by: Camellia ONEIDA Custard, MD 06/23/2024 11:23 AM      [1] Patient Active Problem List Diagnosis  . Migraine without status migrainosus, not intractable  . Shoulder pain  . Asthma  . Cervical radiculopathy  . Morbid obesity (CMD)  . Pain of lower extremity  . Polycystic ovaries  . Subacromial bursitis of right shoulder joint  . Umbilical hernia  . Type 2 diabetes mellitus without complication, without long-term current use of insulin    (CMD)  . Blurry vision  . Intracranial hypertension  . Vision loss  . New onset of headaches  . Sacroiliac pain  . Insomnia due to other mental disorder  . Mixed obsessional thoughts and acts  . PTSD (post-traumatic stress disorder)  . Severe episode of recurrent major depressive disorder, with psychotic features    (CMD)  . Crohn's disease of small intestine with complication    (CMD)  [2]  Current Outpatient Medications:  .  acetaZOLAMIDE (DIAMOX) 500 mg capsule, Take 1 capsule (500 mg total) by mouth 2 (two) times a day., Disp: 60 capsule, Rfl: 11 .  albuterol  HFA (PROVENTIL  HFA;VENTOLIN  HFA;PROAIR  HFA) 90 mcg/actuation inhaler, Inhale 2 puffs every 6 (six) hours as needed for wheezing., Disp: 1 each, Rfl: 3 .  escitalopram  (LEXAPRO ) 10 mg tablet, Take 10 mg by mouth daily., Disp: , Rfl:  .  glipiZIDE (GLUCOTROL XL) 5 mg 24 hr tablet, Take 1 tablet (5 mg total) by mouth in the morning  and 1 tablet (5 mg total) in the evening. Take after meals., Disp: 180 tablet, Rfl: 0 .  hydrOXYzine  (ATARAX ) 10 mg tablet, Take 10 mg by mouth 2 (two) times a day., Disp: , Rfl:  .  lamoTRIgine  (LaMICtal ) 25 mg tablet, Take 25 mg by mouth daily., Disp: , Rfl:  .  loratadine (CLARITIN) 10 mg tablet, Take 10 mg by mouth., Disp: , Rfl:  .  LORazepam (ATIVAN) 1 mg tablet, Take 1 tablet (1 mg total) by mouth once as needed for anxiety., Disp: 1 tablet, Rfl: 0 .  melatonin 5 mg tablet, Take 5 mg by mouth daily after dinner., Disp: , Rfl:  .  multivit-min/ferrous fumarate (MULTI VITAMIN ORAL), Take 1 tablet by mouth daily., Disp: , Rfl:  .  omeprazole (PriLOSEC) 10 mg DR capsule, Take 20 mg by mouth., Disp: , Rfl:  .  semaglutide (OZEMPIC) 0.25 mg or 0.5 mg(2 mg/1.5 mL) subcutaneous pen injector, Inject 0.2 mL (0.25 mg total) under the skin every 7 days., Disp: 3 mL, Rfl: 0 .  traZODone  (DESYREL ) 100 mg tablet, Take 100 mg by mouth at bedtime., Disp: , Rfl:  .  triamcinolone acetonide (KENALOG) 0.1 % cream, Apply topically 2 (two) times a day. (Patient not taking: Reported on 06/17/2024), Disp: 80 g, Rfl: 0

## 2024-06-24 NOTE — Telephone Encounter (Signed)
 Copied from CRM #[id number]. Topic: Medication/Rx - Rx Clarification/Change  >> Jun 24, 2024  2:34 PM Odella DEL wrote: Laura Rodriguez, Laura Rodriguez is calling for medication request (Obtain: Medication Name, Dosage, Quantity, Frequency, Quantity Requested (example: 30-day supply.)   Include all details related to the request(s) below: Patient wants to know if she suppose to take glipiZIDE (GLUCOTROL XL) 5 mg and OZEMPIC as well please call to advise. Need to add new medication to chart from different provider     Confirm and type the Best Contact Number below:  Patient/caller contact number:      [phone]       [] Home  [] Mobile  [] Work [] Other   [x] Okay to leave a voicemail   Medication List:  Current Outpatient Medications:  .  acetaZOLAMIDE (DIAMOX) 500 mg capsule, Take 1 capsule (500 mg total) by mouth 2 (two) times a day., Disp: 60 capsule, Rfl: 11 .  albuterol  HFA (PROVENTIL  HFA;VENTOLIN  HFA;PROAIR  HFA) 90 mcg/actuation inhaler, Inhale 2 puffs every 6 (six) hours as needed for wheezing., Disp: 1 each, Rfl: 3 .  escitalopram  (LEXAPRO ) 10 mg tablet, Take 10 mg by mouth daily., Disp: , Rfl:  .  glipiZIDE (GLUCOTROL XL) 5 mg 24 hr tablet, Take 1 tablet (5 mg total) by mouth in the morning and 1 tablet (5 mg total) in the evening. Take after meals., Disp: 180 tablet, Rfl: 0 .  hydrOXYzine  (ATARAX ) 10 mg tablet, Take 10 mg by mouth 2 (two) times a day., Disp: , Rfl:  .  lamoTRIgine  (LaMICtal ) 25 mg tablet, Take 25 mg by mouth daily., Disp: , Rfl:  .  loratadine (CLARITIN) 10 mg tablet, Take 10 mg by mouth., Disp: , Rfl:  .  LORazepam (ATIVAN) 1 mg tablet, Take 1 tablet (1 mg total) by mouth once as needed for anxiety., Disp: 1 tablet, Rfl: 0 .  melatonin 5 mg tablet, Take 5 mg by mouth daily after dinner., Disp: , Rfl:  .  multivit-min/ferrous fumarate (MULTI VITAMIN ORAL), Take 1 tablet by mouth daily., Disp: , Rfl:  .  omeprazole (PriLOSEC) 10 mg DR capsule, Take 20 mg by mouth., Disp: , Rfl:  .   semaglutide (OZEMPIC) 0.25 mg or 0.5 mg(2 mg/1.5 mL) subcutaneous pen injector, Inject 0.2 mL (0.25 mg total) under the skin every 7 days., Disp: 3 mL, Rfl: 0 .  traZODone  (DESYREL ) 100 mg tablet, Take 100 mg by mouth at bedtime., Disp: , Rfl:  .  triamcinolone acetonide (KENALOG) 0.1 % cream, Apply topically 2 (two) times a day. (Patient not taking: Reported on 06/17/2024), Disp: 80 g, Rfl: 0     Medication Request/Refills: Pharmacy Information (if applicable)   [] Not Applicable       []  Pharmacy listed  Send Medication Request to:                                                 [] Pharmacy not listed (added to pharmacy list in Epic) Send Medication Request to:      Listed Pharmacies: [facility] - [place], [place] - [address] - PHONE: [phone] - FAX: [phone]

## 2024-06-25 NOTE — Progress Notes (Signed)
 I see that patient has been transferred to your service and if you could follow up on her US  abdomen

## 2024-06-25 NOTE — Progress Notes (Signed)
 Care Management Closure Note:   Situation: Member not enrolled in care management services.    Background: Member identified on high risk list from [employer].  Assessment: Outreach call to member.  Explained nature of the call and role of Ambulatory Care Navigator(ACN).   Member denies any care or SDOH needs at this time.  Member is linked with PCP and specialist(s).  Educated member on current care gaps.  Also informed member of PHP added benefits reviewed. ACN provided member opportunity to ask questions. No further questions asked by member. No other symptoms expressed by member at this time. No SDOH needs identified. Denies need for CM services at this time.    Recommendation: If further needs arise, new referral can be sent for Care Management.  ACN sent letter to member via [facility] portal with contact info.  Member encouraged to reach out for any future questions, concerns, or needs.    Jeoffrey Dollar, RN, BSN, CCM Ambulatory [employer] [phone]

## 2024-06-26 NOTE — Telephone Encounter (Signed)
 Spoke with pt at 1325, verified by name and DOB. She verbalized understanding of information.

## 2024-06-30 NOTE — Telephone Encounter (Signed)
 Laura Rodriguez pt again.  She stated she has no one to drive her and no one to help her.    She asked if Dr. Leopoldo has any other options for relief other than the blood patch.  I adv I would send him a message.   In case he offers any medication, I confirmed her pharmacy @ [facility] - [place], [place] - [address] - PHONE: [phone] - FAX: [phone]  [place], RMA / Surgery Scheduler  06/30/2024 1:59 PM

## 2024-06-30 NOTE — Telephone Encounter (Signed)
(  06/30/24 @ 1:25 pm) Spoke with pt about scheduling blood patch.  She voiced agreement to scheduling, but needed to find a driver.  She asked me to call her back around 2 pm.   Vertell, [place] / Surgery Scheduler  06/30/2024 1:57 PM

## 2024-07-01 NOTE — Telephone Encounter (Signed)
 Spoke with pt.  Blood Patch scheduled 07/02/24 @ 11:30 am.  Instructions reviewed verbally and via [facility].   [place], RMA / Surgery Scheduler  07/01/2024 10:12 AM

## 2024-07-02 NOTE — Progress Notes (Signed)
  07/02/2024 Patient Laura Rodriguez did not have IV sedation for this procedure. See flowsheet for vital signs.    Vitals:   07/02/24 1108 07/02/24 1146 07/02/24 1203  BP: 110/83 (!) 137/102 116/81  BP Location:  Right arm Right arm  Pulse: 85 87 81  Resp:  16 16  SpO2: 96% 95% 94%      See PACS for Cumulative Fluoro Time and dose.  Procedure complete. Tolerated well. Patient to recovery for observation via ambulatory. Driver present.  [name]  Thu 07/02/2024 12:25 PM

## 2024-07-02 NOTE — Progress Notes (Addendum)
 1128: PIV placed to LAC, pt tolerated well without complaints. 1151: PIV flushed and 10 cc of blood wasted.  1152: Additional 10 cc of blood withdrawn and given to Dr. Leopoldo for blood patch procedure.  1154: PIV removed.  Patient Lines/Drains/Airways Status     Inactive LDAs     Name Placement date Placement time Removal date Removal time Site Days   [REMOVED] Peripheral IV 07/02/24 22 G Left Antecubital 07/02/24  1128  07/02/24  1154  Antecubital  less than 1      Electronically signed by: Gilmore Rosaline Potters, RN 07/02/2024 1:46 PM

## 2024-07-02 NOTE — Progress Notes (Signed)
 Epidural Blood Patch  Nerve block  Date/Time: 07/02/2024 11:30 AM  Performed by: Camellia Krystal Custard, MD Authorized by: Camellia Krystal Custard, MD   Consent:    Consent obtained:  Verbal   Consent given by:  Patient   Risks, benefits, and alternatives were discussed: yes     Risks discussed:  Allergic reaction, infection, nerve damage, swelling, unsuccessful block, pain, intravenous injection and bleeding   Alternatives discussed:  No treatment Universal protocol:    Procedure explained and questions answered to patient or proxy's satisfaction: yes     Immediately prior to procedure, a time out was called: yes     Patient identity confirmed:  Verbally with patient Indications:    Indications:  Pain relief Location:    Body area:  Trunk   Trunk nerve:  Lumbar   Laterality: Midline. Pre-procedure details:    Skin preparation:  Povidone-iodine Skin anesthesia:    Skin anesthesia method:  Local infiltration   Local anesthetic:  Lidocaine 2% w/o epi Procedure details:    Block needle gauge:  18 G (6 inch)   Guidance: fluoroscopy     Anesthesia technique comment:  Translaminar   Additive injected:  None   Injection procedure:  Anatomic landmarks identified, incremental injection and negative aspiration for blood   Paresthesia:  None Post-procedure details:    Dressing:  None   Outcome:  Pain relieved   Procedure completion:  Tolerated well, no immediate complications Comments:     After informed consent patient placed in the prone position vital signs monitored by medical staff.  Nurses started intravenous access in the left arm.  Skin prepped with Betadine 2% lidocaine for local anesthesia then I placed a 6 inch 18-gauge Touhy needle touching the right side of the sacral alar which was anesthetized with 2% lidocaine then using loss-of-resistance technique needle was advanced forward until this was obtained I then injected 8 cc of patient's own blood into the epidural space no complications  Needles removed intact.  Patient was informed that this may take some time to resolve her headache.

## 2024-07-07 ENCOUNTER — Encounter (HOSPITAL_COMMUNITY): Admitting: Psychiatry
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
## 2024-07-16 ENCOUNTER — Telehealth (INDEPENDENT_AMBULATORY_CARE_PROVIDER_SITE_OTHER): Admitting: Psychiatry

## 2024-07-16 DIAGNOSIS — F411 Generalized anxiety disorder: Secondary | ICD-10-CM

## 2024-07-16 DIAGNOSIS — F422 Mixed obsessional thoughts and acts: Secondary | ICD-10-CM | POA: Diagnosis not present

## 2024-07-16 DIAGNOSIS — F5105 Insomnia due to other mental disorder: Secondary | ICD-10-CM

## 2024-07-16 DIAGNOSIS — F331 Major depressive disorder, recurrent, moderate: Secondary | ICD-10-CM

## 2024-07-16 DIAGNOSIS — F99 Mental disorder, not otherwise specified: Secondary | ICD-10-CM

## 2024-07-16 DIAGNOSIS — F4310 Post-traumatic stress disorder, unspecified: Secondary | ICD-10-CM

## 2024-07-16 MED ORDER — ESCITALOPRAM OXALATE 10 MG PO TABS: 10.0000 mg | ORAL_TABLET | Freq: Every day | ORAL | 1 refills | Status: AC

## 2024-07-16 MED ORDER — TRAZODONE HCL 100 MG PO TABS
100.0000 mg | ORAL_TABLET | Freq: Every day | ORAL | 1 refills | Status: AC
Start: 2024-07-16 — End: 2024-09-14

## 2024-07-16 MED ORDER — ESCITALOPRAM OXALATE 5 MG PO TABS: 5.0000 mg | ORAL_TABLET | Freq: Every day | ORAL | 1 refills | Status: AC

## 2024-09-01 NOTE — Progress Notes (Unsigned)
 ERRONEOUS ENCOUNTER  Patient has established care with another clinic and provider. She wanted to touch base and say goodbye prior to leaving this clinic and provider.

## 2024-09-02 NOTE — Progress Notes (Signed)
 OUTPATIENT  GASTROENTEROLOGY CONSULTATION  REFFERING PROVIDER  Dr. Prentice Blush White  REASON OF CONSULTATION  Abnormal imaging of the upper GI tract, abdominal pain and diarrhea   HISTORY OF PRESENT ILLNESS  This patient is referred to me for further assessment of abnormal finding on UGI along with chronic abdominal pain and diarrhea  She is a poor historian.  She has a notable medical history for DM2, morbid obesity, schizophrenia, bipolar disease, PTSD, and ADHD.  She was evaluated by Dr. Teresa for bariatric surgery earlier this year.  However, surgery is being delayed due to severe mental illness with suicidal ideation.  Initial work up by him includes UGI that shows irregularity in the gastric antrum and question of esophageal dysmotility and poor gastric emptying.  Patient claims that an ER physician diagnosed her with Crohn's disease.  She reports a long history of burning sensation of the entire body, lower abdominal pain and diarrhea with urgency after eating.  She denies rectal bleeding.  There is no family history of IBD.  There is no significant cardiopulmonary disease.  US  reveals hepatosplenomegaly and hepatic steatosis.  She does not drink alcohol or uses illicit drug.  There is no family history of chronic liver disease.  There is mild transaminitis.   REVIEW OF SYSTEMS  See above and negative for fever, chills, night sweats, chest pain, SOB, DOE, rash, arthralgia, melena, hematochezia, unintentional weight loss, anorexia or constitutional symptoms.   MEDICATION & ALLERGY    ALLERGIES Allergies[1]   MEDICATIONS Current Medications[2]  PAST  HISTORY    PAST MEDICAL HISTORY Problem List[3]   PAST SURGICAL HISTORY Surgical History[4]  SOCIAL HISTORY   Tobacco Use History[5] Social History   Substance and Sexual Activity  Alcohol Use Not Currently   Comment: Sober since 2012   Social History   Substance and Sexual Activity  Drug Use Not Currently    Comment: Drug use: Denies    FAMILY HISTORY   Colon cancer: no, Colon polyps: no, IBD: no, Celiac disease: no, Liver disease:no   PHYSICAL EXAM   Vitals:   09/02/24 0946  BP: 130/74  Pulse: 64  Temp: 97.9 F (36.6 C)  SpO2: 97%  Weight: (!) 143 kg (316 lb)  Height: 1.753 m (5' 9)   Body mass index is 46.67 kg/m.  Airway:  MALLAMPATI THREE   Heart:  normal S1 and S2 Lungs:  clear Abdomen:  severe central obesity.  Non-tender to palpation Mental Status:  poor historian  RESULT   LABS  Lab Results  Component Value Date   WBC 8.50 05/15/2024   HGB 14.2 05/15/2024   HCT 40.6 05/15/2024   MCV 90.1 05/15/2024   PLT 252 05/15/2024     Lab Results  Component Value Date   ALT 78 (H) 05/14/2024   AST 63 (H) 05/14/2024   BILITOT 0.5 05/14/2024      IMAGING   Result of UGI and US  are reviewed   ASSESSMENT & PLAN   ASSESSMENT  Chronic lower abdominal pain and diarrhea.  Suspect IBS-D although would need to exclude for other organic causes Abnormal finding of the stomach on UGI Elevated liver enzymes.  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  Would like to exclude for other chronic liver diseases such as Wilson's, hemochromatosis, autoimmune hepatitis as well as sprue  PLAN  1. Obtain celiac panel, CRP, and hepatic serology 2. Schedule EGD and ileocolonoscopy at [facility] due to BMI    Thank you for allowing us  to participate in the care of  this patient.   I have provided this patient with 45 minutes of clinical care - 15 minutes in chart review and 30 minutes of face-to-face interaction and medical decision making    Gunnar HILARIO Daniels,  MD  09/02/2024 10:26 AM     Preparation: Miralax with Gatorade mixture and Ducolax Endoscopist: Dr. Gunnar Daniels, MD ASA: 2 Mallampati: 2 Anesthesia plan: Deep sedation with propofol at [facility]  I discussed the nature of the recommended  ,EGD&Colonoscopy as well as the indications, risks, alternatives and potential complications including, but not limited  to, bleeding, infection, reaction to medication, damage to internal organs, cardiac and/or pulmonary problems, and perforation requiring surgery (1 to 2 in 1000). The possibility that significant findings could be missed was explained. Any questions the patient had were answered. The patient gives consent for the procedure.   Tri Chris Daniels, MD        [1] Allergies Allergen Reactions   Amoxicillin Anaphylaxis   Doxycycline Anaphylaxis   Gabapentin  Psychosis    Extremely bad dreams   Abilify  [Aripiprazole ] Other (See Comments)    06/03/24: pt reports increased blurry vision & increased symptoms with IIH / esl    Azithromycin Hives   Ciprofloxacin    Hydrocodone -Acetaminophen     Latex    Lithium Analogues    Metformin Diarrhea    Abdominal pain it put me in the hospital  [2]  Current Outpatient Medications:    albuterol  HFA (PROVENTIL  HFA;VENTOLIN  HFA;PROAIR  HFA) 90 mcg/actuation inhaler, Inhale 2 puffs every 6 (six) hours as needed for wheezing., Disp: 1 each, Rfl: 3   FLUoxetine (PROzac) 40 mg capsule, Take 1 capsule (40 mg total) by mouth daily., Disp: 90 capsule, Rfl: 0   glipiZIDE (GLUCOTROL XL) 5 mg 24 hr tablet, TAKE 1 TABLET (5 MG TOTAL) BY MOUTH IN THE MORNING AND 1 TABLET (5 MG TOTAL) IN THE EVENING. TAKE AFTER MEALS., Disp: 180 tablet, Rfl: 0   lisdexamfetamine (VYVANSE) 30 mg capsule, Take 1 capsule (30 mg total) by mouth every morning., Disp: 30 capsule, Rfl: 0   loratadine (CLARITIN) 10 mg tablet, Take 10 mg by mouth., Disp: , Rfl:    LORazepam (ATIVAN) 0.5 mg tablet, Take 1 tablets during the days as needed for anxiety and 2 tablets at bedtime, Disp: 90 tablet, Rfl: 1   melatonin 5 mg tablet, Take 5 mg by mouth daily after dinner., Disp: , Rfl:    multivit-min/ferrous fumarate (MULTI VITAMIN ORAL), Take 1 tablet by mouth daily., Disp: , Rfl:    omeprazole (PriLOSEC) 10 mg DR capsule, Take 20 mg by mouth., Disp: , Rfl:    semaglutide (OZEMPIC) 0.25  mg or 0.5 mg(2 mg/1.5 mL) subcutaneous pen injector, Inject 0.4 mL (0.5 mg total) under the skin every 7 days., Disp: 3 mL, Rfl: 0   silver sulfADIAZINE (SILVADENE) 1 % cream, Apply topically daily., Disp: 50 g, Rfl: 3   SUMAtriptan (IMITREX) 100 mg tablet, Take 1 tablet (100 mg total) by mouth once as needed for migraine. May repeat dose once in 2 hours if no relief. Do not exceed 2 doses in 24 hours., Disp: 9 tablet, Rfl: 6   topiramate (TOPAMAX) 100 mg tablet, Take 1 tablet (100 mg total) by mouth daily., Disp: 30 tablet, Rfl: 9   topiramate (TOPAMAX) 25 mg tablet, Take 1 tablet (25 mg total) by mouth daily., Disp: 42 tablet, Rfl: 0   triamcinolone acetonide (KENALOG) 0.1 % cream, Apply topically 2 (two) times a day., Disp: 80 g, Rfl:  0   triamcinolone acetonide (KENALOG) 0.1 % cream, Apply topically 2 (two) times a day., Disp: 45 g, Rfl: 1  Current Facility-Administered Medications:    iohexoL  (OMNIPAQUE ) 300 mg iodine/mL injection (SDV) 15 mL, 15 mL, intra-articular, Once, Camellia Krystal Custard, MD [3] Patient Active Problem List Diagnosis   Migraine without status migrainosus, not intractable   Shoulder pain   Asthma (CMD)   Cervical radiculopathy   Morbid obesity (CMD)   Pain of lower extremity   Polycystic ovaries   Subacromial bursitis of right shoulder joint   Umbilical hernia   Type 2 diabetes mellitus without complication, without long-term current use of insulin    (CMD)   Blurry vision   Intracranial hypertension   Vision loss   New onset of headaches   Sacroiliac pain   Insomnia due to other mental disorder   Mixed obsessional thoughts and acts   PTSD (post-traumatic stress disorder)   Severe episode of recurrent major depressive disorder, with psychotic features    (CMD)   Crohn's disease of small intestine with complication    (CMD)   Attention deficit hyperactivity disorder (ADHD), combined type   GAD (generalized anxiety disorder)    Elevated liver enzymes  [4] Past Surgical History: Procedure Laterality Date   CESAREAN SECTION     CHOLECYSTECTOMY     SACROILIAC JOINT INJECTION Bilateral 08/12/2024   INJECTION/ASPIRATION JOINT SACROILLIAC performed by Toribio Fairy Badder, MD at [facility]  [5] Social History Tobacco Use  Smoking Status Never   Passive exposure: Never  Smokeless Tobacco Never

## 2024-09-03 ENCOUNTER — Encounter (HOSPITAL_COMMUNITY): Payer: MEDICAID | Admitting: Psychiatry
